# Patient Record
Sex: Female | Born: 1970
Health system: Southern US, Community
[De-identification: ages and names within clinical notes are randomized; demographics above are authoritative.]

## PROBLEM LIST (undated history)

## (undated) DIAGNOSIS — E669 Obesity, unspecified: Secondary | ICD-10-CM

## (undated) DIAGNOSIS — I10 Essential (primary) hypertension: Secondary | ICD-10-CM

## (undated) HISTORY — DX: Essential (primary) hypertension: I10

## (undated) HISTORY — PX: COLONOSCOPY: SHX174

## (undated) HISTORY — DX: Obesity, unspecified: E66.9

---

## 2005-03-20 ENCOUNTER — Emergency Department (HOSPITAL_COMMUNITY): Admission: EM | Admit: 2005-03-20 | Discharge: 2005-03-20 | Payer: Self-pay | Admitting: Family Medicine

## 2006-05-02 ENCOUNTER — Emergency Department (HOSPITAL_COMMUNITY): Admission: EM | Admit: 2006-05-02 | Discharge: 2006-05-02 | Payer: Self-pay | Admitting: Family Medicine

## 2009-06-22 ENCOUNTER — Encounter: Admission: RE | Admit: 2009-06-22 | Discharge: 2009-06-22 | Payer: Self-pay | Admitting: Internal Medicine

## 2010-05-21 ENCOUNTER — Encounter: Payer: Self-pay | Admitting: Internal Medicine

## 2014-10-20 ENCOUNTER — Other Ambulatory Visit: Payer: Self-pay

## 2014-10-20 DIAGNOSIS — Z1231 Encounter for screening mammogram for malignant neoplasm of breast: Secondary | ICD-10-CM

## 2014-10-27 ENCOUNTER — Ambulatory Visit
Admission: RE | Admit: 2014-10-27 | Discharge: 2014-10-27 | Disposition: A | Discharge status: Home / Self Care | Payer: Commercial Managed Care - HMO | Source: Ambulatory Visit

## 2014-10-27 DIAGNOSIS — Z1231 Encounter for screening mammogram for malignant neoplasm of breast: Secondary | ICD-10-CM

## 2017-03-27 ENCOUNTER — Other Ambulatory Visit: Payer: Self-pay | Admitting: Nurse Practitioner

## 2017-03-27 ENCOUNTER — Ambulatory Visit
Admission: RE | Admit: 2017-03-27 | Discharge: 2017-03-27 | Disposition: A | Discharge status: Home / Self Care | Payer: Worker's Compensation | Source: Ambulatory Visit | Attending: Nurse Practitioner | Admitting: Nurse Practitioner

## 2017-03-27 DIAGNOSIS — M25461 Effusion, right knee: Secondary | ICD-10-CM

## 2017-03-27 DIAGNOSIS — M25532 Pain in left wrist: Secondary | ICD-10-CM

## 2017-03-27 DIAGNOSIS — W19XXXA Unspecified fall, initial encounter: Secondary | ICD-10-CM

## 2017-03-27 DIAGNOSIS — M25561 Pain in right knee: Secondary | ICD-10-CM

## 2017-03-27 DIAGNOSIS — M25432 Effusion, left wrist: Secondary | ICD-10-CM

## 2017-05-27 DIAGNOSIS — I10 Essential (primary) hypertension: Secondary | ICD-10-CM | POA: Diagnosis not present

## 2017-05-27 DIAGNOSIS — K061 Gingival enlargement: Secondary | ICD-10-CM | POA: Diagnosis not present

## 2017-05-27 DIAGNOSIS — Z0000 Encounter for general adult medical examination without abnormal findings: Secondary | ICD-10-CM | POA: Diagnosis not present

## 2017-06-05 ENCOUNTER — Other Ambulatory Visit: Payer: Self-pay | Admitting: Internal Medicine

## 2017-06-05 DIAGNOSIS — Z1231 Encounter for screening mammogram for malignant neoplasm of breast: Secondary | ICD-10-CM

## 2017-06-25 ENCOUNTER — Ambulatory Visit: Payer: Self-pay

## 2017-06-26 ENCOUNTER — Ambulatory Visit
Admission: RE | Admit: 2017-06-26 | Discharge: 2017-06-26 | Disposition: A | Discharge status: Home / Self Care | Payer: 59 | Source: Ambulatory Visit | Attending: Internal Medicine | Admitting: Internal Medicine

## 2017-06-26 DIAGNOSIS — Z1231 Encounter for screening mammogram for malignant neoplasm of breast: Secondary | ICD-10-CM

## 2018-04-27 ENCOUNTER — Other Ambulatory Visit: Payer: Self-pay | Admitting: Internal Medicine

## 2018-06-09 ENCOUNTER — Other Ambulatory Visit: Payer: Self-pay | Admitting: Internal Medicine

## 2018-06-23 ENCOUNTER — Encounter: Payer: Self-pay | Admitting: Internal Medicine

## 2018-06-23 ENCOUNTER — Ambulatory Visit (INDEPENDENT_AMBULATORY_CARE_PROVIDER_SITE_OTHER): Payer: 59 | Admitting: Internal Medicine

## 2018-06-23 VITALS — BP 136/88 | HR 78 | Temp 98.1°F | Ht 68.4 in | Wt 273.4 lb

## 2018-06-23 DIAGNOSIS — Z0000 Encounter for general adult medical examination without abnormal findings: Secondary | ICD-10-CM

## 2018-06-23 DIAGNOSIS — I10 Essential (primary) hypertension: Secondary | ICD-10-CM

## 2018-06-23 LAB — POCT URINALYSIS DIPSTICK
BILIRUBIN UA: NEGATIVE
Blood, UA: NEGATIVE
GLUCOSE UA: NEGATIVE
KETONES UA: NEGATIVE
Nitrite, UA: NEGATIVE
Protein, UA: NEGATIVE
Spec Grav, UA: 1.025 (ref 1.010–1.025)
UROBILINOGEN UA: 1 U/dL
pH, UA: 6.5 (ref 5.0–8.0)

## 2018-06-23 LAB — POCT UA - MICROALBUMIN
ALBUMIN/CREATININE RATIO, URINE, POC: 300
Creatinine, POC: 300 mg/dL
Microalbumin Ur, POC: 80 mg/L

## 2018-06-23 NOTE — Patient Instructions (Signed)

## 2018-06-23 NOTE — Progress Notes (Signed)
Subjective:     Patient ID: Yvonne Waters , female    DOB: [birth year] , 48 y.o.   MRN: [id number]   Chief Complaint  Patient presents with  . Annual Exam  . Hypertension    HPI  She is here today for a full physical examination.  She is followed by Dr. Radene Waters for her GYN exams. She reports she goes every year.   Hypertension  This is a chronic problem. The current episode started more than 1 year ago. The problem has been gradually improving since onset. The problem is controlled. Pertinent negatives include no blurred vision, chest pain, palpitations or shortness of breath. Compliance problems include exercise.    She reports compliance with meds.   History reviewed. No pertinent past medical history.   Family History  Problem Relation Age of Onset  . Hypertension Mother      Current Outpatient Medications:  .  Olmesartan-amLODIPine-HCTZ 40-10-25 MG TABS, Take 1 tablet by mouth daily., Disp: , Rfl:  .  Vitamin D, Ergocalciferol, (DRISDOL) 1.25 MG (50000 UT) CAPS capsule, TAKE ONE CAPSULE ON TUESDAY AND FRIDAY EVERY WEEK, Disp: 8 capsule, Rfl: 2   No Known Allergies   Review of Systems  Constitutional: Negative.   HENT: Negative.   Eyes: Negative.  Negative for blurred vision.  Respiratory: Negative.  Negative for shortness of breath.   Cardiovascular: Negative.  Negative for chest pain and palpitations.  Gastrointestinal: Negative.   Endocrine: Negative.   Genitourinary: Negative.   Musculoskeletal: Negative.   Skin: Negative.   Allergic/Immunologic: Negative.   Neurological: Negative.   Hematological: Negative.   Psychiatric/Behavioral: Negative.      Today's Vitals   06/23/18 0930  BP: 136/88  Pulse: 78  Temp: 98.1 F (36.7 C)  TempSrc: Oral  Weight: 273 lb 6.4 oz (124 kg)  Height: 5' 8.4" (1.737 m)   Body mass index is 41.09 kg/m.   Objective:  Physical Exam Vitals signs and nursing note reviewed.  Constitutional:      Appearance: Normal  appearance. She is obese.  HENT:     Head: Normocephalic and atraumatic.     Right Ear: Tympanic membrane, ear canal and external ear normal.     Left Ear: Tympanic membrane, ear canal and external ear normal.     Nose: Nose normal.     Mouth/Throat:     Mouth: Mucous membranes are moist.     Pharynx: Oropharynx is clear.     Comments: Gingival hyperplasia Eyes:     Extraocular Movements: Extraocular movements intact.     Conjunctiva/sclera: Conjunctivae normal.     Pupils: Pupils are equal, round, and reactive to light.  Neck:     Musculoskeletal: Normal range of motion and neck supple.  Cardiovascular:     Rate and Rhythm: Normal rate and regular rhythm.     Pulses: Normal pulses.     Heart sounds: Normal heart sounds.  Pulmonary:     Effort: Pulmonary effort is normal.     Breath sounds: Normal breath sounds.  Chest:     Breasts:        Right: Normal. No swelling, bleeding, inverted nipple, mass or nipple discharge.        Left: Normal. No swelling, bleeding, inverted nipple, mass or nipple discharge.  Abdominal:     General: Bowel sounds are normal.     Palpations: Abdomen is soft.  Genitourinary:    Comments: deferred Musculoskeletal: Normal range of motion.  Skin:  General: Skin is warm and dry.  Neurological:     General: No focal deficit present.     Mental Status: She is alert.  Psychiatric:        Mood and Affect: Mood normal.        Behavior: Behavior normal.         Assessment And Plan:     1. Routine general medical examination at a health care facility  A full exam was performed.  Importance of monthly self breast exams was discussed to the patient. PATIENT HAS BEEN ADVISED TO GET 30-45 MINUTES REGULAR EXERCISE NO LESS THAN FOUR TO FIVE DAYS PER WEEK - BOTH WEIGHTBEARING EXERCISES AND AEROBIC ARE RECOMMENDED.  SHE IS ADVISED TO FOLLOW A HEALTHY DIET WITH AT LEAST SIX FRUITS/VEGGIES PER DAY, DECREASE INTAKE OF RED MEAT, AND TO INCREASE FISH INTAKE TO  TWO DAYS PER WEEK.  MEATS/FISH SHOULD NOT BE FRIED, BAKED OR BROILED IS PREFERABLE.  I SUGGEST WEARING SPF 50 SUNSCREEN ON EXPOSED PARTS AND ESPECIALLY WHEN IN THE DIRECT SUNLIGHT FOR AN EXTENDED PERIOD OF TIME.  PLEASE AVOID FAST FOOD RESTAURANTS AND INCREASE YOUR WATER INTAKE.  - CMP14+EGFR - CBC - Lipid panel - Hemoglobin A1c - TSH  2. Essential hypertension, benign  Fair control. She will continue with current meds. She is encouraged to avoid adding salt to her foods. EKG performed, no new changes noted. She will rto in six months for re-evaluation.  - EKG 12-Lead - POCT Urinalysis Dipstick (90383) - POCT UA - Microalbumin        Yvonne Greenland, MD

## 2018-06-24 LAB — CMP14+EGFR
A/G RATIO: 1.2 (ref 1.2–2.2)
ALT: 11 IU/L (ref 0–32)
AST: 12 IU/L (ref 0–40)
Albumin: 3.8 g/dL (ref 3.8–4.8)
Alkaline Phosphatase: 104 IU/L (ref 39–117)
BILIRUBIN TOTAL: 0.4 mg/dL (ref 0.0–1.2)
BUN/Creatinine Ratio: 13 (ref 9–23)
BUN: 10 mg/dL (ref 6–24)
CALCIUM: 8.9 mg/dL (ref 8.7–10.2)
CO2: 26 mmol/L (ref 20–29)
Chloride: 102 mmol/L (ref 96–106)
Creatinine, Ser: 0.79 mg/dL (ref 0.57–1.00)
GFR calc Af Amer: 104 mL/min/{1.73_m2} (ref >59)
GFR, EST NON AFRICAN AMERICAN: 90 mL/min/{1.73_m2} (ref >59)
Globulin, Total: 3.2 g/dL (ref 1.5–4.5)
Glucose: 113 mg/dL — ABNORMAL HIGH (ref 65–99)
POTASSIUM: 4 mmol/L (ref 3.5–5.2)
Sodium: 141 mmol/L (ref 134–144)
Total Protein: 7 g/dL (ref 6.0–8.5)

## 2018-06-24 LAB — LIPID PANEL
CHOLESTEROL TOTAL: 166 mg/dL (ref 100–199)
Chol/HDL Ratio: 2.3 ratio (ref 0.0–4.4)
HDL: 71 mg/dL (ref >39)
LDL CALC: 84 mg/dL (ref 0–99)
TRIGLYCERIDES: 56 mg/dL (ref 0–149)
VLDL Cholesterol Cal: 11 mg/dL (ref 5–40)

## 2018-06-24 LAB — CBC
HEMOGLOBIN: 12.5 g/dL (ref 11.1–15.9)
Hematocrit: 37.8 % (ref 34.0–46.6)
MCH: 28.1 pg (ref 26.6–33.0)
MCHC: 33.1 g/dL (ref 31.5–35.7)
MCV: 85 fL (ref 79–97)
Platelets: 257 10*3/uL (ref 150–450)
RBC: 4.45 x10E6/uL (ref 3.77–5.28)
RDW: 13.4 % (ref 11.7–15.4)
WBC: 6.1 10*3/uL (ref 3.4–10.8)

## 2018-06-24 LAB — HEMOGLOBIN A1C
Est. average glucose Bld gHb Est-mCnc: 131 mg/dL
Hgb A1c MFr Bld: 6.2 % — ABNORMAL HIGH (ref 4.8–5.6)

## 2018-06-24 LAB — TSH: TSH: 0.653 u[IU]/mL (ref 0.450–4.500)

## 2018-07-02 ENCOUNTER — Other Ambulatory Visit: Payer: Self-pay | Admitting: Internal Medicine

## 2018-07-02 DIAGNOSIS — Z1231 Encounter for screening mammogram for malignant neoplasm of breast: Secondary | ICD-10-CM

## 2018-07-31 ENCOUNTER — Ambulatory Visit: Payer: Self-pay

## 2018-10-01 ENCOUNTER — Other Ambulatory Visit: Payer: Self-pay

## 2018-10-01 ENCOUNTER — Ambulatory Visit
Admission: RE | Admit: 2018-10-01 | Discharge: 2018-10-01 | Disposition: A | Discharge status: Home / Self Care | Payer: 59 | Source: Ambulatory Visit | Attending: Internal Medicine | Admitting: Internal Medicine

## 2018-10-01 DIAGNOSIS — Z1231 Encounter for screening mammogram for malignant neoplasm of breast: Secondary | ICD-10-CM

## 2018-11-02 ENCOUNTER — Other Ambulatory Visit: Payer: Self-pay | Admitting: Internal Medicine

## 2018-12-22 ENCOUNTER — Other Ambulatory Visit: Payer: Self-pay

## 2018-12-22 ENCOUNTER — Ambulatory Visit: Payer: 59 | Admitting: Internal Medicine

## 2018-12-22 ENCOUNTER — Encounter: Payer: Self-pay | Admitting: Internal Medicine

## 2018-12-22 VITALS — BP 144/90 | HR 72 | Temp 98.8°F | Ht 68.4 in | Wt 268.6 lb

## 2018-12-22 DIAGNOSIS — Z6841 Body Mass Index (BMI) 40.0 and over, adult: Secondary | ICD-10-CM | POA: Diagnosis not present

## 2018-12-22 DIAGNOSIS — R7309 Other abnormal glucose: Secondary | ICD-10-CM | POA: Diagnosis not present

## 2018-12-22 DIAGNOSIS — I10 Essential (primary) hypertension: Secondary | ICD-10-CM | POA: Diagnosis not present

## 2018-12-22 LAB — BMP8+EGFR
BUN/Creatinine Ratio: 11 (ref 9–23)
BUN: 10 mg/dL (ref 6–24)
CO2: 24 mmol/L (ref 20–29)
Calcium: 9.3 mg/dL (ref 8.7–10.2)
Chloride: 101 mmol/L (ref 96–106)
Creatinine, Ser: 0.93 mg/dL (ref 0.57–1.00)
GFR calc Af Amer: 85 mL/min/{1.73_m2} (ref >59)
GFR calc non Af Amer: 74 mL/min/{1.73_m2} (ref >59)
Glucose: 109 mg/dL — ABNORMAL HIGH (ref 65–99)
Potassium: 3.9 mmol/L (ref 3.5–5.2)
Sodium: 138 mmol/L (ref 134–144)

## 2018-12-22 LAB — HEMOGLOBIN A1C
Est. average glucose Bld gHb Est-mCnc: 128 mg/dL
Hgb A1c MFr Bld: 6.1 % — ABNORMAL HIGH (ref 4.8–5.6)

## 2018-12-22 MED ORDER — MAGNESIUM 400 MG PO CAPS: 400.0000 mg | ORAL_CAPSULE | Freq: Every day | ORAL | 1 refills | Status: DC

## 2018-12-22 MED ORDER — OLMESARTAN-AMLODIPINE-HCTZ 40-10-25 MG PO TABS: 1.0000 | ORAL_TABLET | Freq: Every day | ORAL | 1 refills | Status: DC

## 2018-12-22 NOTE — Progress Notes (Signed)
Subjective:     Patient ID: Yvonne Waters , female    DOB: Jun 26, 1970 , 48 y.o.   MRN: [id number]   Chief Complaint  Patient presents with  . Hypertension    HPI  Hypertension This is a chronic problem. The current episode started more than 1 year ago. The problem has been gradually improving since onset. The problem is uncontrolled. Pertinent negatives include no blurred vision, chest pain, palpitations or shortness of breath. Past treatments include angiotensin blockers, calcium channel blockers and diuretics. The current treatment provides moderate improvement. Compliance problems include exercise.      History reviewed. No pertinent past medical history.   Family History  Problem Relation Age of Onset  . Hypertension Mother   . Healthy Father      Current Outpatient Medications:  Marland Kitchen  Vitamin D, Ergocalciferol, (DRISDOL) 1.25 MG (50000 UT) CAPS capsule, TAKE ONE CAPSULE ON TUESDAY AND FRIDAY EVERY WEEK, Disp: 8 capsule, Rfl: 2 .  Magnesium 400 MG CAPS, Take 400 mg by mouth at bedtime., Disp: 90 capsule, Rfl: 1 .  Olmesartan-amLODIPine-HCTZ 40-10-25 MG TABS, Take 1 tablet by mouth daily., Disp: 90 tablet, Rfl: 1   No Known Allergies   Review of Systems  Constitutional: Negative.   Eyes: Negative for blurred vision.  Respiratory: Negative.  Negative for shortness of breath.   Cardiovascular: Negative.  Negative for chest pain and palpitations.  Gastrointestinal: Negative.   Neurological: Negative.   Psychiatric/Behavioral: Negative.      Today's Vitals   12/22/18 0847  BP: (!) 144/90  Pulse: 72  Temp: 98.8 F (37.1 C)  TempSrc: Oral  Weight: 268 lb 10.4 oz (121.9 kg)  Height: 5' 8.4" (1.737 m)   Body mass index is 40.37 kg/m.   Objective:  Physical Exam Vitals signs and nursing note reviewed.  Constitutional:      Appearance: Normal appearance. She is obese.  HENT:     Head: Normocephalic and atraumatic.  Cardiovascular:     Rate and Rhythm: Normal rate  and regular rhythm.     Heart sounds: Normal heart sounds.  Pulmonary:     Effort: Pulmonary effort is normal.     Breath sounds: Normal breath sounds.  Skin:    General: Skin is warm.  Neurological:     General: No focal deficit present.     Mental Status: She is alert.  Psychiatric:        Mood and Affect: Mood normal.        Behavior: Behavior normal.         Assessment And Plan:     1. Essential hypertension, benign  Fair control.  She will continue with current meds for now. I will add magnesium 450m nightly to her current regimen. She will rto in March 2021 for her next physical examination.   - BMP8+EGFR  2. Other abnormal glucose  HER A1C HAS BEEN ELEVATED IN THE PAST. I WILL CHECK AN A1C, BMET TODAY. SHE WAS ENCOURAGED TO AVOID SUGARY BEVERAGES AND PROCESSED FOODS INCLUDNG BREADS, RICE AND PASTA.  - Hemoglobin A1c  3. Class 3 severe obesity due to excess calories with serious comorbidity and body mass index (BMI) of 40.0 to 44.9 in adult ([facility]  She was congratulated on her 5 pound weight loss and encouraged to keep up the great work.  Importance of achieving optimal weight to decrease risk of cardiovascular disease and cancers was discussed with the patient in full detail. She is encouraged to exercise no less  than five days weekly, for at least 30 minutes.   She was given tips to incorporate more activity into her daily routine - take stairs when possible, park farther away from her job, grocery stores, etc.    Maximino Greenland, MD    THE PATIENT IS ENCOURAGED TO PRACTICE SOCIAL DISTANCING DUE TO THE COVID-19 PANDEMIC.

## 2018-12-22 NOTE — Patient Instructions (Signed)

## 2018-12-24 ENCOUNTER — Other Ambulatory Visit: Payer: Self-pay | Admitting: Internal Medicine

## 2018-12-28 ENCOUNTER — Other Ambulatory Visit: Payer: Self-pay | Admitting: Internal Medicine

## 2018-12-31 ENCOUNTER — Telehealth: Payer: Self-pay

## 2018-12-31 NOTE — Telephone Encounter (Signed)
Left vm for pt to return call. Pt should take 5000 units of vit d otc capsules

## 2019-04-16 HISTORY — PX: OTHER SURGICAL HISTORY: SHX169

## 2019-06-21 ENCOUNTER — Other Ambulatory Visit: Payer: Self-pay | Admitting: Internal Medicine

## 2019-06-29 ENCOUNTER — Other Ambulatory Visit: Payer: Self-pay

## 2019-06-29 ENCOUNTER — Ambulatory Visit: Payer: 59 | Admitting: Internal Medicine

## 2019-06-29 ENCOUNTER — Encounter: Payer: Self-pay | Admitting: Internal Medicine

## 2019-06-29 VITALS — BP 144/92 | HR 77 | Temp 98.7°F | Ht 71.4 in | Wt 247.0 lb

## 2019-06-29 DIAGNOSIS — E6609 Other obesity due to excess calories: Secondary | ICD-10-CM | POA: Diagnosis not present

## 2019-06-29 DIAGNOSIS — Z6834 Body mass index (BMI) 34.0-34.9, adult: Secondary | ICD-10-CM

## 2019-06-29 DIAGNOSIS — Z1231 Encounter for screening mammogram for malignant neoplasm of breast: Secondary | ICD-10-CM

## 2019-06-29 DIAGNOSIS — I10 Essential (primary) hypertension: Secondary | ICD-10-CM | POA: Diagnosis not present

## 2019-06-29 DIAGNOSIS — Z1211 Encounter for screening for malignant neoplasm of colon: Secondary | ICD-10-CM

## 2019-06-29 DIAGNOSIS — Z0000 Encounter for general adult medical examination without abnormal findings: Secondary | ICD-10-CM

## 2019-06-29 LAB — POCT URINALYSIS DIPSTICK
Bilirubin, UA: NEGATIVE
Blood, UA: NEGATIVE
Glucose, UA: NEGATIVE
Ketones, UA: NEGATIVE
Nitrite, UA: NEGATIVE
Protein, UA: NEGATIVE
Spec Grav, UA: 1.025 (ref 1.010–1.025)
Urobilinogen, UA: 1 E.U./dL
pH, UA: 7.5 (ref 5.0–8.0)

## 2019-06-29 LAB — POCT UA - MICROALBUMIN
Albumin/Creatinine Ratio, Urine, POC: <30
Creatinine, POC: 200 mg/dL
Microalbumin Ur, POC: 10 mg/L

## 2019-06-29 MED ORDER — OLMESARTAN-AMLODIPINE-HCTZ 40-10-25 MG PO TABS: 1.0000 | ORAL_TABLET | Freq: Every day | ORAL | 2 refills | Status: DC

## 2019-06-29 NOTE — Patient Instructions (Signed)
Health Maintenance, Female Adopting a healthy lifestyle and getting preventive care are important in promoting health and wellness. Ask your health care provider about:  The right schedule for you to have regular tests and exams.  Things you can do on your own to prevent diseases and keep yourself healthy. What should I know about diet, weight, and exercise? Eat a healthy diet   Eat a diet that includes plenty of vegetables, fruits, low-fat dairy products, and lean protein.  Do not eat a lot of foods that are high in solid fats, added sugars, or sodium. Maintain a healthy weight Body mass index (BMI) is used to identify weight problems. It estimates body fat based on height and weight. Your health care provider can help determine your BMI and help you achieve or maintain a healthy weight. Get regular exercise Get regular exercise. This is one of the most important things you can do for your health. Most adults should:  Exercise for at least 150 minutes each week. The exercise should increase your heart rate and make you sweat (moderate-intensity exercise).  Do strengthening exercises at least twice a week. This is in addition to the moderate-intensity exercise.  Spend less time sitting. Even light physical activity can be beneficial. Watch cholesterol and blood lipids Have your blood tested for lipids and cholesterol at 48 years of age, then have this test every 5 years. Have your cholesterol levels checked more often if:  Your lipid or cholesterol levels are high.  You are older than 48 years of age.  You are at high risk for heart disease. What should I know about cancer screening? Depending on your health history and family history, you may need to have cancer screening at various ages. This may include screening for:  Breast cancer.  Cervical cancer.  Colorectal cancer.  Skin cancer.  Lung cancer. What should I know about heart disease, diabetes, and high blood  pressure? Blood pressure and heart disease  High blood pressure causes heart disease and increases the risk of stroke. This is more likely to develop in people who have high blood pressure readings, are of African descent, or are overweight.  Have your blood pressure checked: ? Every 3-5 years if you are 18-39 years of age. ? Every year if you are 40 years old or older. Diabetes Have regular diabetes screenings. This checks your fasting blood sugar level. Have the screening done:  Once every three years after age 40 if you are at a normal weight and have a low risk for diabetes.  More often and at a younger age if you are overweight or have a high risk for diabetes. What should I know about preventing infection? Hepatitis B If you have a higher risk for hepatitis B, you should be screened for this virus. Talk with your health care provider to find out if you are at risk for hepatitis B infection. Hepatitis C Testing is recommended for:  Everyone born from 1945 through 1965.  Anyone with known risk factors for hepatitis C. Sexually transmitted infections (STIs)  Get screened for STIs, including gonorrhea and chlamydia, if: ? You are sexually active and are younger than 48 years of age. ? You are older than 48 years of age and your health care provider tells you that you are at risk for this type of infection. ? Your sexual activity has changed since you were last screened, and you are at increased risk for chlamydia or gonorrhea. Ask your health care provider if   you are at risk.  Ask your health care provider about whether you are at high risk for HIV. Your health care provider may recommend a prescription medicine to help prevent HIV infection. If you choose to take medicine to prevent HIV, you should first get tested for HIV. You should then be tested every 3 months for as long as you are taking the medicine. Pregnancy  If you are about to stop having your period (premenopausal) and  you may become pregnant, seek counseling before you get pregnant.  Take 400 to 800 micrograms (mcg) of folic acid every day if you become pregnant.  Ask for birth control (contraception) if you want to prevent pregnancy. Osteoporosis and menopause Osteoporosis is a disease in which the bones lose minerals and strength with aging. This can result in bone fractures. If you are 65 years old or older, or if you are at risk for osteoporosis and fractures, ask your health care provider if you should:  Be screened for bone loss.  Take a calcium or vitamin D supplement to lower your risk of fractures.  Be given hormone replacement therapy (HRT) to treat symptoms of menopause. Follow these instructions at home: Lifestyle  Do not use any products that contain nicotine or tobacco, such as cigarettes, e-cigarettes, and chewing tobacco. If you need help quitting, ask your health care provider.  Do not use street drugs.  Do not share needles.  Ask your health care provider for help if you need support or information about quitting drugs. Alcohol use  Do not drink alcohol if: ? Your health care provider tells you not to drink. ? You are pregnant, may be pregnant, or are planning to become pregnant.  If you drink alcohol: ? Limit how much you use to 0-1 drink a day. ? Limit intake if you are breastfeeding.  Be aware of how much alcohol is in your drink. In the [place] one drink equals one 12 oz bottle of beer (355 mL), one 5 oz glass of wine (148 mL), or one 1 oz glass of hard liquor (44 mL). General instructions  Schedule regular health, dental, and eye exams.  Stay current with your vaccines.  Tell your health care provider if: ? You often feel depressed. ? You have ever been abused or do not feel safe at home. Summary  Adopting a healthy lifestyle and getting preventive care are important in promoting health and wellness.  Follow your health care provider's instructions about healthy  diet, exercising, and getting tested or screened for diseases.  Follow your health care provider's instructions on monitoring your cholesterol and blood pressure. This information is not intended to replace advice given to you by your health care provider. Make sure you discuss any questions you have with your health care provider. Document Revised: 04/09/2018 Document Reviewed: 04/09/2018 Elsevier Patient Education  2020 [employer]  

## 2019-06-29 NOTE — Progress Notes (Signed)
This visit occurred during the SARS-CoV-2 public health emergency.  Safety protocols were in place, including screening questions prior to the visit, additional usage of staff PPE, and extensive cleaning of exam room while observing appropriate contact time as indicated for disinfecting solutions.  Subjective:     Patient ID: Yvonne Waters , female    DOB: [birth year] , 49 y.o.   MRN: [id number]   Chief Complaint  Patient presents with  . Annual Exam  . Hypertension    HPI  She is here today for a full physical exam. She is followed by Dr. Saul Fordyce for her GYN exams.  Her last exam was 2 years ago.  She has no specific concerns or complaints at this time. She reports compliance with meds.   Hypertension This is a chronic problem. The current episode started more than 1 year ago. The problem has been gradually improving since onset. The problem is uncontrolled. Pertinent negatives include no blurred vision, chest pain, palpitations or shortness of breath. Past treatments include angiotensin blockers, calcium channel blockers and diuretics. The current treatment provides moderate improvement. Compliance problems include exercise.      Past Medical History:  Diagnosis Date  . HTN (hypertension)   . Obesity      Family History  Problem Relation Age of Onset  . Hypertension Mother   . Healthy Father      Current Outpatient Medications:  Marland Kitchen  Magnesium 400 MG CAPS, Take 400 mg by mouth at bedtime., Disp: 90 capsule, Rfl: 1 .  Olmesartan-amLODIPine-HCTZ 40-10-25 MG TABS, Take 1 tablet by mouth daily., Disp: 90 tablet, Rfl: 2 .  VITAMIN D PO, Take by mouth., Disp: , Rfl:  .  Vitamin D, Ergocalciferol, (DRISDOL) 1.25 MG (50000 UT) CAPS capsule, TAKE ONE CAPSULE ON TUESDAY AND FRIDAY EVERY WEEK (Patient not taking: Reported on 06/29/2019), Disp: 8 capsule, Rfl: 2   No Known Allergies    The patient states she uses none for birth control. Last LMP was Patient's last menstrual period was  06/08/2019.. Negative for Dysmenorrhea Negative for: breast discharge, breast lump(s), breast pain and breast self exam. Associated symptoms include abnormal vaginal bleeding. Pertinent negatives include abnormal bleeding (hematology), anxiety, decreased libido, depression, difficulty falling sleep, dyspareunia, history of infertility, nocturia, sexual dysfunction, sleep disturbances, urinary incontinence, urinary urgency, vaginal discharge and vaginal itching. Diet regular.The patient states her exercise level is  intermittent.   . The patient's tobacco use is:  Social History   Tobacco Use  Smoking Status Never Smoker  Smokeless Tobacco Never Used  . She has been exposed to passive smoke. The patient's alcohol use is:  Social History   Substance and Sexual Activity  Alcohol Use Never    Review of Systems  Constitutional: Negative.   HENT: Negative.   Eyes: Negative.  Negative for blurred vision.  Respiratory: Negative.  Negative for shortness of breath.   Cardiovascular: Negative.  Negative for chest pain and palpitations.  Endocrine: Negative.   Genitourinary: Negative.   Musculoskeletal: Negative.   Skin: Negative.   Allergic/Immunologic: Negative.   Neurological: Negative.   Hematological: Negative.   Psychiatric/Behavioral: Negative.      Today's Vitals   06/29/19 0922  BP: (!) 144/92  Pulse: 77  Temp: 98.7 F (37.1 C)  TempSrc: Oral  Weight: 247 lb (112 kg)  Height: 5' 11.4" (1.814 m)   Body mass index is 34.06 kg/m.   Wt Readings from Last 3 Encounters:  06/29/19 247 lb (112 kg)  12/22/18 268 lb  10.4 oz (121.9 kg)  06/23/18 273 lb 6.4 oz (124 kg)     Objective:  Physical Exam Vitals and nursing note reviewed.  Constitutional:      Appearance: Normal appearance.  HENT:     Head: Normocephalic and atraumatic.     Right Ear: Tympanic membrane, ear canal and external ear normal.     Left Ear: Tympanic membrane, ear canal and external ear normal.      Nose:     Comments: Deferred, masked    Mouth/Throat:     Comments: Deferred, masked Eyes:     Extraocular Movements: Extraocular movements intact.     Conjunctiva/sclera: Conjunctivae normal.     Pupils: Pupils are equal, round, and reactive to light.  Cardiovascular:     Rate and Rhythm: Normal rate and regular rhythm.     Pulses: Normal pulses.     Heart sounds: Normal heart sounds.  Pulmonary:     Effort: Pulmonary effort is normal.     Breath sounds: Normal breath sounds.  Chest:     Breasts: Tanner Score is 5.        Right: Normal.        Left: Normal.  Abdominal:     General: Abdomen is flat. Bowel sounds are normal.     Palpations: Abdomen is soft.  Genitourinary:    Comments: deferred Musculoskeletal:        General: Normal range of motion.     Cervical back: Normal range of motion and neck supple.  Skin:    General: Skin is warm and dry.  Neurological:     General: No focal deficit present.     Mental Status: She is alert and oriented to person, place, and time.  Psychiatric:        Mood and Affect: Mood normal.        Behavior: Behavior normal.         Assessment And Plan:     1. Routine general medical examination at health care facility  A full exam was performed.  Importance of monthly breast exam was discussed with the patient. PATIENT IS ADVISED TO GET 30-45 MINUTES REGULAR EXERCISE NO LESS THAN FOUR TO FIVE DAYS PER WEEK - BOTH WEIGHTBEARING EXERCISES AND AEROBIC ARE RECOMMENDED.  HE/SHE IS ADVISED TO FOLLOW A HEALTHY DIET WITH AT LEAST SIX FRUITS/VEGGIES PER DAY, DECREASE INTAKE OF RED MEAT, AND TO INCREASE FISH INTAKE TO TWO DAYS PER WEEK.  MEATS/FISH SHOULD NOT BE FRIED, BAKED OR BROILED IS PREFERABLE.  I SUGGEST WEARING SPF 50 SUNSCREEN ON EXPOSED PARTS AND ESPECIALLY WHEN IN THE DIRECT SUNLIGHT FOR AN EXTENDED PERIOD OF TIME.  PLEASE AVOID FAST FOOD RESTAURANTS AND INCREASE YOUR WATER INTAKE.  - CMP14+EGFR - CBC - Lipid panel - Hemoglobin A1c -  TSH  2. Essential hypertension, benign  Chronic, fair control. She will continue with current meds for now. Repeat BP was 134/96. She is encouraged to avoid adding salt to her foods.  She will rto in 2 weeks for a nurse visit. EKG performed, NSR w/o acute changes.   - POCT Urinalysis Dipstick (81002) - POCT UA - Microalbumin - EKG 12-Lead   3. Class 1 obesity due to excess calories with serious comorbidity and body mass index (BMI) of 34.0 to 34.9 in adult  She was congratulated on her 21 pound weight loss in the past six months. She is encouraged to strive for BMI less than 30 to decrease cardiac risk. I advised her to  aim for 30 minutes of exercise five days per week.   4. Colon cancer screening  I will refer her to GI for CRC screening.   - Ambulatory referral to Gastroenterology  5. Encounter for screening mammogram for malignant neoplasm of breast  [facility].   - MM Digital Screening; Future   Maximino Greenland, MD    THE PATIENT IS ENCOURAGED TO PRACTICE SOCIAL DISTANCING DUE TO THE COVID-19 PANDEMIC.

## 2019-06-30 LAB — CMP14+EGFR
ALT: 10 IU/L (ref 0–32)
AST: 12 IU/L (ref 0–40)
Albumin/Globulin Ratio: 1.3 (ref 1.2–2.2)
Albumin: 4 g/dL (ref 3.8–4.8)
Alkaline Phosphatase: 101 IU/L (ref 39–117)
BUN/Creatinine Ratio: 12 (ref 9–23)
BUN: 9 mg/dL (ref 6–24)
Bilirubin Total: 0.3 mg/dL (ref 0.0–1.2)
CO2: 23 mmol/L (ref 20–29)
Calcium: 9.3 mg/dL (ref 8.7–10.2)
Chloride: 101 mmol/L (ref 96–106)
Creatinine, Ser: 0.76 mg/dL (ref 0.57–1.00)
GFR calc Af Amer: 108 mL/min/{1.73_m2} (ref >59)
GFR calc non Af Amer: 94 mL/min/{1.73_m2} (ref >59)
Globulin, Total: 3.2 g/dL (ref 1.5–4.5)
Glucose: 99 mg/dL (ref 65–99)
Potassium: 4 mmol/L (ref 3.5–5.2)
Sodium: 138 mmol/L (ref 134–144)
Total Protein: 7.2 g/dL (ref 6.0–8.5)

## 2019-06-30 LAB — CBC
Hematocrit: 34.7 % (ref 34.0–46.6)
Hemoglobin: 11.1 g/dL (ref 11.1–15.9)
MCH: 26.9 pg (ref 26.6–33.0)
MCHC: 32 g/dL (ref 31.5–35.7)
MCV: 84 fL (ref 79–97)
Platelets: 230 10*3/uL (ref 150–450)
RBC: 4.12 x10E6/uL (ref 3.77–5.28)
RDW: 13.5 % (ref 11.7–15.4)
WBC: 7.1 10*3/uL (ref 3.4–10.8)

## 2019-06-30 LAB — LIPID PANEL
Chol/HDL Ratio: 2.4 ratio (ref 0.0–4.4)
Cholesterol, Total: 188 mg/dL (ref 100–199)
HDL: 80 mg/dL (ref >39)
LDL Chol Calc (NIH): 93 mg/dL (ref 0–99)
Triglycerides: 86 mg/dL (ref 0–149)
VLDL Cholesterol Cal: 15 mg/dL (ref 5–40)

## 2019-06-30 LAB — HEMOGLOBIN A1C
Est. average glucose Bld gHb Est-mCnc: 117 mg/dL
Hgb A1c MFr Bld: 5.7 % — ABNORMAL HIGH (ref 4.8–5.6)

## 2019-06-30 LAB — TSH: TSH: 2.07 u[IU]/mL (ref 0.450–4.500)

## 2019-07-01 ENCOUNTER — Telehealth: Payer: Self-pay

## 2019-07-01 NOTE — Telephone Encounter (Signed)
-----   Message from Dorothyann Peng, MD sent at 06/30/2019  8:23 PM EST ----- All labs nl. Chol is great. Predm has improved, a1c down t o.57 from 6.1. Normal is 5.6, she is almost there! Keep up the great work! Thyroid fxn is nl.

## 2019-07-14 ENCOUNTER — Other Ambulatory Visit: Payer: Self-pay

## 2019-07-14 ENCOUNTER — Ambulatory Visit: Payer: 59

## 2019-07-14 VITALS — BP 140/86 | HR 74 | Temp 98.7°F | Ht 71.4 in | Wt 248.0 lb

## 2019-07-14 DIAGNOSIS — I10 Essential (primary) hypertension: Secondary | ICD-10-CM

## 2019-07-14 NOTE — Progress Notes (Signed)
Pt presents today for b/p check pt is currently on Olmesartan-amlodipine HCTZ 40-10-25 MG 1x daily in the a.m. B/p today 140/86 Pt has been working out

## 2019-10-07 ENCOUNTER — Ambulatory Visit
Admission: RE | Admit: 2019-10-07 | Discharge: 2019-10-07 | Disposition: A | Discharge status: Home / Self Care | Payer: 59 | Source: Ambulatory Visit | Attending: Internal Medicine | Admitting: Internal Medicine

## 2019-10-07 ENCOUNTER — Other Ambulatory Visit: Payer: Self-pay

## 2019-10-07 DIAGNOSIS — Z1231 Encounter for screening mammogram for malignant neoplasm of breast: Secondary | ICD-10-CM

## 2019-10-07 LAB — HM COLONOSCOPY

## 2019-10-08 ENCOUNTER — Encounter: Payer: Self-pay | Admitting: Internal Medicine

## 2019-12-30 ENCOUNTER — Encounter: Payer: Self-pay | Admitting: Internal Medicine

## 2019-12-30 ENCOUNTER — Ambulatory Visit: Payer: 59 | Admitting: Internal Medicine

## 2019-12-30 ENCOUNTER — Other Ambulatory Visit: Payer: Self-pay

## 2019-12-30 VITALS — BP 136/70 | HR 64 | Temp 98.5°F | Ht 71.0 in | Wt 245.0 lb

## 2019-12-30 DIAGNOSIS — I10 Essential (primary) hypertension: Secondary | ICD-10-CM

## 2019-12-30 DIAGNOSIS — R7309 Other abnormal glucose: Secondary | ICD-10-CM | POA: Diagnosis not present

## 2019-12-30 DIAGNOSIS — Z1159 Encounter for screening for other viral diseases: Secondary | ICD-10-CM

## 2019-12-30 DIAGNOSIS — E6609 Other obesity due to excess calories: Secondary | ICD-10-CM

## 2019-12-30 DIAGNOSIS — Z6834 Body mass index (BMI) 34.0-34.9, adult: Secondary | ICD-10-CM

## 2019-12-30 NOTE — Progress Notes (Signed)
I,Tianna Badgett,acting as a Education administrator for Maximino Greenland, MD.,have documented all relevant documentation on the behalf of Maximino Greenland, MD,as directed by  Maximino Greenland, MD while in the presence of Maximino Greenland, MD.  This visit occurred during the SARS-CoV-2 public health emergency.  Safety protocols were in place, including screening questions prior to the visit, additional usage of staff PPE, and extensive cleaning of exam room while observing appropriate contact time as indicated for disinfecting solutions.  Subjective:     Patient ID: Yvonne Waters , female    DOB: 1970/07/30 , 49 y.o.   MRN: [id number]   Chief Complaint  Patient presents with  . Hypertension    HPI  Patient is here for hypertension follow up. She states that she is compliant with medication.   Hypertension This is a chronic problem. The current episode started more than 1 year ago. The problem has been gradually improving since onset. The problem is uncontrolled. Pertinent negatives include no blurred vision, chest pain, palpitations or shortness of breath. Past treatments include angiotensin blockers, calcium channel blockers and diuretics. The current treatment provides moderate improvement. Compliance problems include exercise.      Past Medical History:  Diagnosis Date  . HTN (hypertension)   . Obesity      Family History  Problem Relation Age of Onset  . Hypertension Mother   . Healthy Father      Current Outpatient Medications:  Marland Kitchen  Magnesium 400 MG CAPS, Take 400 mg by mouth at bedtime., Disp: 90 capsule, Rfl: 1 .  Multiple Vitamins-Minerals (MULTIVITAMIN WOMEN PO), Take 1 tablet by mouth daily., Disp: , Rfl:  .  Olmesartan-amLODIPine-HCTZ 40-10-25 MG TABS, Take 1 tablet by mouth daily., Disp: 90 tablet, Rfl: 2   No Known Allergies   Review of Systems  Constitutional: Negative.   Eyes: Negative for blurred vision.  Respiratory: Negative.  Negative for shortness of breath.    Cardiovascular: Negative.  Negative for chest pain and palpitations.  Gastrointestinal: Negative.   Neurological: Negative.      Today's Vitals   12/30/19 0853  BP: 136/70  Pulse: 64  Temp: 98.5 F (36.9 C)  TempSrc: Oral  Weight: 245 lb (111.1 kg)  Height: '5\' 11"'  (1.803 m)   Body mass index is 34.17 kg/m.   Objective:  Physical Exam Vitals and nursing note reviewed.  Constitutional:      Appearance: Normal appearance. She is obese.  HENT:     Head: Normocephalic and atraumatic.  Cardiovascular:     Rate and Rhythm: Normal rate and regular rhythm.     Heart sounds: Normal heart sounds.  Pulmonary:     Effort: Pulmonary effort is normal.     Breath sounds: Normal breath sounds.  Skin:    General: Skin is warm.  Neurological:     General: No focal deficit present.     Mental Status: She is alert.  Psychiatric:        Mood and Affect: Mood normal.        Behavior: Behavior normal.         Assessment And Plan:     1. Essential hypertension, benign Comments: Chronic, improved control.  She is aware that optimal BP is less than 130/80.  She will continue with current meds. She will rto in 6 months for physical.  - BMP8+EGFR   2. Other abnormal glucose  HER A1C HAS BEEN ELEVATED IN THE PAST. I WILL CHECK AN A1C, BMET TODAY. SHE  WAS ENCOURAGED TO AVOID SUGARY BEVERAGES AND PROCESSED FOODS INCLUDNG BREADS, RICE AND PASTA.  - Hemoglobin A1c  3. Need for hepatitis C screening test Comments: I will check hepatitis c virus antibody.  - Hepatitis C antibody  4. Class 1 obesity due to excess calories with serious comorbidity and body mass index (BMI) of 34.0 to 34.9 in adult She is encouraged to strive for BMI less than 30 to decrease cardiac risk. Advised to aim for at least 150 minutes of exercise per week.  She was congratulated for maintaining her weight loss.   Wt Readings from Last 3 Encounters:  12/30/19 245 lb (111.1 kg)  07/14/19 248 lb (112.5 kg)   06/29/19 247 lb (112 kg)      Patient was given opportunity to ask questions. Patient verbalized understanding of the plan and was able to repeat key elements of the plan. All questions were answered to their satisfaction.  Maximino Greenland, MD   I, Maximino Greenland, MD, have reviewed all documentation for this visit. The documentation on 12/30/19 for the exam, diagnosis, procedures, and orders are all accurate and complete.  THE PATIENT IS ENCOURAGED TO PRACTICE SOCIAL DISTANCING DUE TO THE COVID-19 PANDEMIC.

## 2019-12-30 NOTE — Patient Instructions (Signed)

## 2019-12-31 LAB — BMP8+EGFR
BUN/Creatinine Ratio: 14 (ref 9–23)
BUN: 10 mg/dL (ref 6–24)
CO2: 24 mmol/L (ref 20–29)
Calcium: 9 mg/dL (ref 8.7–10.2)
Chloride: 101 mmol/L (ref 96–106)
Creatinine, Ser: 0.71 mg/dL (ref 0.57–1.00)
GFR calc Af Amer: 116 mL/min/{1.73_m2} (ref >59)
GFR calc non Af Amer: 101 mL/min/{1.73_m2} (ref >59)
Glucose: 101 mg/dL — ABNORMAL HIGH (ref 65–99)
Potassium: 4 mmol/L (ref 3.5–5.2)
Sodium: 136 mmol/L (ref 134–144)

## 2019-12-31 LAB — HEPATITIS C ANTIBODY: Hep C Virus Ab: <0.1 s/co ratio (ref 0.0–0.9)

## 2019-12-31 LAB — HEMOGLOBIN A1C
Est. average glucose Bld gHb Est-mCnc: 128 mg/dL
Hgb A1c MFr Bld: 6.1 % — ABNORMAL HIGH (ref 4.8–5.6)

## 2020-02-03 DIAGNOSIS — R87619 Unspecified abnormal cytological findings in specimens from cervix uteri: Secondary | ICD-10-CM | POA: Insufficient documentation

## 2020-02-10 LAB — HM PAP SMEAR

## 2020-06-29 ENCOUNTER — Ambulatory Visit: Payer: 59 | Admitting: Internal Medicine

## 2020-06-29 ENCOUNTER — Other Ambulatory Visit: Payer: Self-pay

## 2020-06-29 ENCOUNTER — Encounter: Payer: Self-pay | Admitting: Internal Medicine

## 2020-06-29 VITALS — BP 126/82 | HR 70 | Temp 98.2°F | Ht 71.0 in | Wt 251.0 lb

## 2020-06-29 DIAGNOSIS — Z0000 Encounter for general adult medical examination without abnormal findings: Secondary | ICD-10-CM | POA: Diagnosis not present

## 2020-06-29 DIAGNOSIS — Z6835 Body mass index (BMI) 35.0-35.9, adult: Secondary | ICD-10-CM | POA: Diagnosis not present

## 2020-06-29 DIAGNOSIS — I10 Essential (primary) hypertension: Secondary | ICD-10-CM | POA: Diagnosis not present

## 2020-06-29 LAB — POCT URINALYSIS DIPSTICK
Bilirubin, UA: NEGATIVE
Blood, UA: NEGATIVE
Glucose, UA: NEGATIVE
Ketones, UA: NEGATIVE
Leukocytes, UA: NEGATIVE
Nitrite, UA: NEGATIVE
Protein, UA: NEGATIVE
Spec Grav, UA: >=1.03 — AB (ref 1.010–1.025)
Urobilinogen, UA: 0.2 E.U./dL
pH, UA: 7 (ref 5.0–8.0)

## 2020-06-29 LAB — POCT UA - MICROALBUMIN
Albumin/Creatinine Ratio, Urine, POC: 30
Creatinine, POC: 200 mg/dL
Microalbumin Ur, POC: 10 mg/L

## 2020-06-29 MED ORDER — OLMESARTAN-AMLODIPINE-HCTZ 40-10-25 MG PO TABS: 1.0000 | ORAL_TABLET | Freq: Every day | ORAL | 2 refills | Status: DC

## 2020-06-29 NOTE — Progress Notes (Signed)
I,Katawbba Wiggins,acting as a Education administrator for Maximino Greenland, MD.,have documented all relevant documentation on the behalf of Maximino Greenland, MD,as directed by  Maximino Greenland, MD while in the presence of Maximino Greenland, MD.  This visit occurred during the SARS-CoV-2 public health emergency.  Safety protocols were in place, including screening questions prior to the visit, additional usage of staff PPE, and extensive cleaning of exam room while observing appropriate contact time as indicated for disinfecting solutions.  Subjective:     Patient ID: Yvonne Waters , female    DOB: August 25, 1970 , 50 y.o.   MRN: [id number]   Chief Complaint  Patient presents with  . Annual Exam    HPI  She is here today for a full physical exam. She is followed by Dr. Saul Fordyce for her GYN exams.  Her last exam was November 2021.  She reports compliance with meds. Reports she is exercising 3-5 days per week. She denies headaches, chest pain and shortness of breath.   Hypertension This is a chronic problem. The current episode started more than 1 year ago. The problem has been gradually improving since onset. The problem is uncontrolled. Pertinent negatives include no blurred vision, chest pain, palpitations or shortness of breath. Past treatments include angiotensin blockers, calcium channel blockers and diuretics. The current treatment provides moderate improvement. Compliance problems include exercise.      Past Medical History:  Diagnosis Date  . HTN (hypertension)   . Obesity      Family History  Problem Relation Age of Onset  . Hypertension Mother   . Healthy Father      Current Outpatient Medications:  Marland Kitchen  Magnesium 400 MG CAPS, Take 400 mg by mouth at bedtime., Disp: 90 capsule, Rfl: 1 .  Multiple Vitamins-Minerals (MULTIVITAMIN WOMEN PO), Take 1 tablet by mouth daily., Disp: , Rfl:  .  Olmesartan-amLODIPine-HCTZ 40-10-25 MG TABS, Take 1 tablet by mouth daily., Disp: 90 tablet, Rfl: 2   No Known  Allergies    The patient states she uses none for birth control. Last LMP was Patient's last menstrual period was 06/10/2020.. Negative for Dysmenorrhea. Negative for: breast discharge, breast lump(s), breast pain and breast self exam. Associated symptoms include abnormal vaginal bleeding. Pertinent negatives include abnormal bleeding (hematology), anxiety, decreased libido, depression, difficulty falling sleep, dyspareunia, history of infertility, nocturia, sexual dysfunction, sleep disturbances, urinary incontinence, urinary urgency, vaginal discharge and vaginal itching. Diet regular.The patient states her exercise level is  moderate.   . The patient's tobacco use is:  Social History   Tobacco Use  Smoking Status Never Smoker  Smokeless Tobacco Never Used  . She has been exposed to passive smoke. The patient's alcohol use is:  Social History   Substance and Sexual Activity  Alcohol Use Never    Review of Systems  Constitutional: Negative.   HENT: Negative.   Eyes: Negative.  Negative for blurred vision.  Respiratory: Negative.  Negative for shortness of breath.   Cardiovascular: Negative.  Negative for chest pain and palpitations.  Gastrointestinal: Negative.   Endocrine: Negative.   Genitourinary: Negative.   Musculoskeletal: Negative.   Skin: Negative.   Allergic/Immunologic: Negative.   Neurological: Negative.   Hematological: Negative.   Psychiatric/Behavioral: Negative.      Today's Vitals   06/29/20 0924  BP: 126/82  Pulse: 70  Temp: 98.2 F (36.8 C)  TempSrc: Oral  Weight: 251 lb (113.9 kg)  Height: '5\' 11"'  (1.803 m)   Body mass index is 35.01 kg/m.  Wt Readings from Last 3 Encounters:  06/29/20 251 lb (113.9 kg)  12/30/19 245 lb (111.1 kg)  07/14/19 248 lb (112.5 kg)   Objective:  Physical Exam Vitals and nursing note reviewed.  Constitutional:      General: She is not in acute distress.    Appearance: Normal appearance. She is well-developed. She is  obese.  HENT:     Head: Normocephalic and atraumatic.     Right Ear: Hearing, tympanic membrane, ear canal and external ear normal. There is no impacted cerumen.     Left Ear: Hearing, tympanic membrane, ear canal and external ear normal. There is no impacted cerumen.     Nose:     Comments: Deferred, masked     Mouth/Throat:     Comments: Deferred, masked  Eyes:     General: Lids are normal.     Extraocular Movements: Extraocular movements intact.     Conjunctiva/sclera: Conjunctivae normal.     Pupils: Pupils are equal, round, and reactive to light.     Funduscopic exam:    Right eye: No papilledema.        Left eye: No papilledema.  Neck:     Thyroid: No thyroid mass.     Vascular: No carotid bruit.  Cardiovascular:     Rate and Rhythm: Normal rate and regular rhythm.     Pulses: Normal pulses.     Heart sounds: Normal heart sounds. No murmur heard.   Pulmonary:     Effort: Pulmonary effort is normal.     Breath sounds: Normal breath sounds.  Abdominal:     General: Bowel sounds are normal. There is no distension.     Palpations: Abdomen is soft.     Tenderness: There is no abdominal tenderness.  Genitourinary:    Comments: deferred Musculoskeletal:        General: No swelling. Normal range of motion.     Cervical back: Full passive range of motion without pain, normal range of motion and neck supple.     Right lower leg: No edema.     Left lower leg: No edema.  Skin:    General: Skin is warm and dry.     Capillary Refill: Capillary refill takes less than 2 seconds.     Comments: Keloid right breast.  Tattoos b/l UE  Neurological:     General: No focal deficit present.     Mental Status: She is alert and oriented to person, place, and time.     Cranial Nerves: No cranial nerve deficit.     Sensory: No sensory deficit.  Psychiatric:        Mood and Affect: Mood normal.        Behavior: Behavior normal.        Thought Content: Thought content normal.         Judgment: Judgment normal.         Assessment And Plan:     1. Routine general medical examination at health care facility Comments: A full exam was performed. Importance of monthly self breast exams was discussed with the patient. PATIENT IS ADVISED TO GET 30-45 MINUTES REGULAR EXERCISE NO LESS THAN FOUR TO FIVE DAYS PER WEEK - BOTH WEIGHTBEARING EXERCISES AND AEROBIC ARE RECOMMENDED.  PATIENT IS ADVISED TO FOLLOW A HEALTHY DIET WITH AT LEAST SIX FRUITS/VEGGIES PER DAY, DECREASE INTAKE OF RED MEAT, AND TO INCREASE FISH INTAKE TO TWO DAYS PER WEEK.  MEATS/FISH SHOULD NOT BE FRIED, BAKED OR BROILED IS  PREFERABLE.  I SUGGEST WEARING SPF 50 SUNSCREEN ON EXPOSED PARTS AND ESPECIALLY WHEN IN THE DIRECT SUNLIGHT FOR AN EXTENDED PERIOD OF TIME.  PLEASE AVOID FAST FOOD RESTAURANTS AND INCREASE YOUR WATER INTAKE.  - Hemoglobin A1c - CBC - CMP14+EGFR - Lipid panel  2. Essential hypertension, benign Comments: Chronic, well controlled. She is encouraged to follow low sodium diet. EKG performed, NSR w/o acute changes. She will c/w Tribenzor and f/u in 6 months.  - POCT Urinalysis Dipstick (81002) - POCT UA - Microalbumin - EKG 12-Lead  3. Class 2 severe obesity due to excess calories with serious comorbidity and body mass index (BMI) of 35.0 to 35.9 in adult [facility]) Comments: She was commended on her regular exercise regimen. Encouraged to strive for BMI less than 30 to decrease cardiac risk.   Patient was given opportunity to ask questions. Patient verbalized understanding of the plan and was able to repeat key elements of the plan. All questions were answered to their satisfaction.   I, Maximino Greenland, MD, have reviewed all documentation for this visit. The documentation on 06/29/20 for the exam, diagnosis, procedures, and orders are all accurate and complete.  THE PATIENT IS ENCOURAGED TO PRACTICE SOCIAL DISTANCING DUE TO THE COVID-19 PANDEMIC.

## 2020-06-29 NOTE — Patient Instructions (Signed)
Health Maintenance, Female Adopting a healthy lifestyle and getting preventive care are important in promoting health and wellness. Ask your health care provider about:  The right schedule for you to have regular tests and exams.  Things you can do on your own to prevent diseases and keep yourself healthy. What should I know about diet, weight, and exercise? Eat a healthy diet  Eat a diet that includes plenty of vegetables, fruits, low-fat dairy products, and lean protein.  Do not eat a lot of foods that are high in solid fats, added sugars, or sodium.   Maintain a healthy weight Body mass index (BMI) is used to identify weight problems. It estimates body fat based on height and weight. Your health care provider can help determine your BMI and help you achieve or maintain a healthy weight. Get regular exercise Get regular exercise. This is one of the most important things you can do for your health. Most adults should:  Exercise for at least 150 minutes each week. The exercise should increase your heart rate and make you sweat (moderate-intensity exercise).  Do strengthening exercises at least twice a week. This is in addition to the moderate-intensity exercise.  Spend less time sitting. Even light physical activity can be beneficial. Watch cholesterol and blood lipids Have your blood tested for lipids and cholesterol at 49 years of age, then have this test every 5 years. Have your cholesterol levels checked more often if:  Your lipid or cholesterol levels are high.  You are older than 49 years of age.  You are at high risk for heart disease. What should I know about cancer screening? Depending on your health history and family history, you may need to have cancer screening at various ages. This may include screening for:  Breast cancer.  Cervical cancer.  Colorectal cancer.  Skin cancer.  Lung cancer. What should I know about heart disease, diabetes, and high blood  pressure? Blood pressure and heart disease  High blood pressure causes heart disease and increases the risk of stroke. This is more likely to develop in people who have high blood pressure readings, are of African descent, or are overweight.  Have your blood pressure checked: ? Every 3-5 years if you are 18-39 years of age. ? Every year if you are 40 years old or older. Diabetes Have regular diabetes screenings. This checks your fasting blood sugar level. Have the screening done:  Once every three years after age 40 if you are at a normal weight and have a low risk for diabetes.  More often and at a younger age if you are overweight or have a high risk for diabetes. What should I know about preventing infection? Hepatitis B If you have a higher risk for hepatitis B, you should be screened for this virus. Talk with your health care provider to find out if you are at risk for hepatitis B infection. Hepatitis C Testing is recommended for:  Everyone born from 1945 through 1965.  Anyone with known risk factors for hepatitis C. Sexually transmitted infections (STIs)  Get screened for STIs, including gonorrhea and chlamydia, if: ? You are sexually active and are younger than 49 years of age. ? You are older than 49 years of age and your health care provider tells you that you are at risk for this type of infection. ? Your sexual activity has changed since you were last screened, and you are at increased risk for chlamydia or gonorrhea. Ask your health care provider   if you are at risk.  Ask your health care provider about whether you are at high risk for HIV. Your health care provider may recommend a prescription medicine to help prevent HIV infection. If you choose to take medicine to prevent HIV, you should first get tested for HIV. You should then be tested every 3 months for as long as you are taking the medicine. Pregnancy  If you are about to stop having your period (premenopausal) and  you may become pregnant, seek counseling before you get pregnant.  Take 400 to 800 micrograms (mcg) of folic acid every day if you become pregnant.  Ask for birth control (contraception) if you want to prevent pregnancy. Osteoporosis and menopause Osteoporosis is a disease in which the bones lose minerals and strength with aging. This can result in bone fractures. If you are 65 years old or older, or if you are at risk for osteoporosis and fractures, ask your health care provider if you should:  Be screened for bone loss.  Take a calcium or vitamin D supplement to lower your risk of fractures.  Be given hormone replacement therapy (HRT) to treat symptoms of menopause. Follow these instructions at home: Lifestyle  Do not use any products that contain nicotine or tobacco, such as cigarettes, e-cigarettes, and chewing tobacco. If you need help quitting, ask your health care provider.  Do not use street drugs.  Do not share needles.  Ask your health care provider for help if you need support or information about quitting drugs. Alcohol use  Do not drink alcohol if: ? Your health care provider tells you not to drink. ? You are pregnant, may be pregnant, or are planning to become pregnant.  If you drink alcohol: ? Limit how much you use to 0-1 drink a day. ? Limit intake if you are breastfeeding.  Be aware of how much alcohol is in your drink. In the [place] one drink equals one 12 oz bottle of beer (355 mL), one 5 oz glass of wine (148 mL), or one 1 oz glass of hard liquor (44 mL). General instructions  Schedule regular health, dental, and eye exams.  Stay current with your vaccines.  Tell your health care provider if: ? You often feel depressed. ? You have ever been abused or do not feel safe at home. Summary  Adopting a healthy lifestyle and getting preventive care are important in promoting health and wellness.  Follow your health care provider's instructions about healthy  diet, exercising, and getting tested or screened for diseases.  Follow your health care provider's instructions on monitoring your cholesterol and blood pressure. This information is not intended to replace advice given to you by your health care provider. Make sure you discuss any questions you have with your health care provider. Document Revised: 04/09/2018 Document Reviewed: 04/09/2018 Elsevier Patient Education  2021 [employer] Inc[employer]  

## 2020-06-30 LAB — CBC
Hematocrit: 36.6 % (ref 34.0–46.6)
Hemoglobin: 12.2 g/dL (ref 11.1–15.9)
MCH: 28.8 pg (ref 26.6–33.0)
MCHC: 33.3 g/dL (ref 31.5–35.7)
MCV: 86 fL (ref 79–97)
Platelets: 228 10*3/uL (ref 150–450)
RBC: 4.24 x10E6/uL (ref 3.77–5.28)
RDW: 13.8 % (ref 11.7–15.4)
WBC: 6 10*3/uL (ref 3.4–10.8)

## 2020-06-30 LAB — CMP14+EGFR
ALT: 13 IU/L (ref 0–32)
AST: 16 IU/L (ref 0–40)
Albumin/Globulin Ratio: 1.5 (ref 1.2–2.2)
Albumin: 4.1 g/dL (ref 3.8–4.8)
Alkaline Phosphatase: 93 IU/L (ref 44–121)
BUN/Creatinine Ratio: 10 (ref 9–23)
BUN: 8 mg/dL (ref 6–24)
Bilirubin Total: 0.3 mg/dL (ref 0.0–1.2)
CO2: 25 mmol/L (ref 20–29)
Calcium: 9.3 mg/dL (ref 8.7–10.2)
Chloride: 103 mmol/L (ref 96–106)
Creatinine, Ser: 0.81 mg/dL (ref 0.57–1.00)
Globulin, Total: 2.8 g/dL (ref 1.5–4.5)
Glucose: 101 mg/dL — ABNORMAL HIGH (ref 65–99)
Potassium: 3.8 mmol/L (ref 3.5–5.2)
Sodium: 142 mmol/L (ref 134–144)
Total Protein: 6.9 g/dL (ref 6.0–8.5)
eGFR: 89 mL/min/{1.73_m2} (ref >59)

## 2020-06-30 LAB — LIPID PANEL
Chol/HDL Ratio: 2.1 ratio (ref 0.0–4.4)
Cholesterol, Total: 197 mg/dL (ref 100–199)
HDL: 93 mg/dL (ref >39)
LDL Chol Calc (NIH): 93 mg/dL (ref 0–99)
Triglycerides: 58 mg/dL (ref 0–149)
VLDL Cholesterol Cal: 11 mg/dL (ref 5–40)

## 2020-06-30 LAB — HEMOGLOBIN A1C
Est. average glucose Bld gHb Est-mCnc: 128 mg/dL
Hgb A1c MFr Bld: 6.1 % — ABNORMAL HIGH (ref 4.8–5.6)

## 2020-07-08 ENCOUNTER — Encounter: Payer: Self-pay | Admitting: Internal Medicine

## 2020-11-18 ENCOUNTER — Other Ambulatory Visit: Payer: Self-pay | Admitting: Internal Medicine

## 2020-11-18 DIAGNOSIS — Z1231 Encounter for screening mammogram for malignant neoplasm of breast: Secondary | ICD-10-CM

## 2020-11-21 ENCOUNTER — Ambulatory Visit
Admission: RE | Admit: 2020-11-21 | Discharge: 2020-11-21 | Disposition: A | Discharge status: Home / Self Care | Payer: 59 | Source: Ambulatory Visit | Attending: Internal Medicine | Admitting: Internal Medicine

## 2020-11-21 ENCOUNTER — Other Ambulatory Visit: Payer: Self-pay

## 2020-11-21 DIAGNOSIS — Z1231 Encounter for screening mammogram for malignant neoplasm of breast: Secondary | ICD-10-CM

## 2021-01-05 ENCOUNTER — Ambulatory Visit: Payer: 59 | Admitting: Internal Medicine

## 2021-01-05 ENCOUNTER — Other Ambulatory Visit: Payer: Self-pay

## 2021-01-05 ENCOUNTER — Encounter: Payer: Self-pay | Admitting: Internal Medicine

## 2021-01-05 VITALS — BP 116/82 | HR 66 | Temp 98.2°F | Ht 71.0 in | Wt 251.6 lb

## 2021-01-05 DIAGNOSIS — M278 Other specified diseases of jaws: Secondary | ICD-10-CM | POA: Diagnosis not present

## 2021-01-05 DIAGNOSIS — I10 Essential (primary) hypertension: Secondary | ICD-10-CM

## 2021-01-05 DIAGNOSIS — R7309 Other abnormal glucose: Secondary | ICD-10-CM

## 2021-01-05 DIAGNOSIS — Z6835 Body mass index (BMI) 35.0-35.9, adult: Secondary | ICD-10-CM

## 2021-01-05 NOTE — Patient Instructions (Signed)

## 2021-01-05 NOTE — Progress Notes (Signed)
I,Katawbba Wiggins,acting as a Education administrator for Maximino Greenland, MD.,have documented all relevant documentation on the behalf of Maximino Greenland, MD,as directed by  Maximino Greenland, MD while in the presence of Maximino Greenland, MD.  This visit occurred during the SARS-CoV-2 public health emergency.  Safety protocols were in place, including screening questions prior to the visit, additional usage of staff PPE, and extensive cleaning of exam room while observing appropriate contact time as indicated for disinfecting solutions.  Subjective:     Patient ID: Yvonne Waters , female    DOB: 09-20-1970 , 50 y.o.   MRN: [id number]   Chief Complaint  Patient presents with   Hypertension    HPI  Patient is here for hypertension follow up. She states that she is compliant with medication. She denies headaches, chest pain and shortness of breath.   Hypertension This is a chronic problem. The current episode started more than 1 year ago. The problem has been gradually improving since onset. The problem is uncontrolled. Pertinent negatives include no blurred vision, chest pain, palpitations or shortness of breath. Past treatments include angiotensin blockers, calcium channel blockers and diuretics. The current treatment provides moderate improvement. Compliance problems include exercise.     Past Medical History:  Diagnosis Date   HTN (hypertension)    Obesity      Family History  Problem Relation Age of Onset   Hypertension Mother    Healthy Father      Current Outpatient Medications:    Olmesartan-amLODIPine-HCTZ 40-10-25 MG TABS, Take 1 tablet by mouth daily., Disp: 90 tablet, Rfl: 2   Magnesium 400 MG CAPS, Take 400 mg by mouth at bedtime. (Patient not taking: Reported on 01/05/2021), Disp: 90 capsule, Rfl: 1   Multiple Vitamins-Minerals (MULTIVITAMIN WOMEN PO), Take 1 tablet by mouth daily. (Patient not taking: Reported on 01/05/2021), Disp: , Rfl:    No Known Allergies   Review of Systems   Constitutional: Negative.   HENT:         She c/o "bump" on her left jaw. She admits that it has been there for months. No pain. No pain with chewing. She feels it swells on occasion. She thinks the swelling is precipitated by chewing.   Eyes:  Negative for blurred vision.  Respiratory: Negative.  Negative for shortness of breath.   Cardiovascular: Negative.  Negative for chest pain and palpitations.  Gastrointestinal: Negative.   Psychiatric/Behavioral: Negative.    All other systems reviewed and are negative.   Today's Vitals   01/05/21 0850  BP: 116/82  Pulse: 66  Temp: 98.2 F (36.8 C)  TempSrc: Oral  Weight: 251 lb 9.6 oz (114.1 kg)  Height: 5' 11" (1.803 m)   Body mass index is 35.09 kg/m.  Wt Readings from Last 3 Encounters:  01/05/21 251 lb 9.6 oz (114.1 kg)  06/29/20 251 lb (113.9 kg)  12/30/19 245 lb (111.1 kg)    BP Readings from Last 3 Encounters:  01/05/21 116/82  06/29/20 126/82  12/30/19 136/70    Objective:  Physical Exam Vitals and nursing note reviewed.  Constitutional:      Appearance: Normal appearance. She is obese.  HENT:     Head: Normocephalic and atraumatic.      Comments: Soft, mobile, nontender mass lateral to left jaw    Nose:     Comments: Masked     Mouth/Throat:     Comments: Masked  Eyes:     Extraocular Movements: Extraocular movements intact.  Cardiovascular:  Rate and Rhythm: Normal rate and regular rhythm.     Heart sounds: Normal heart sounds.  Pulmonary:     Effort: Pulmonary effort is normal.     Breath sounds: Normal breath sounds.  Musculoskeletal:     Cervical back: Normal range of motion.  Skin:    General: Skin is warm.  Neurological:     General: No focal deficit present.     Mental Status: She is alert.  Psychiatric:        Mood and Affect: Mood normal.        Behavior: Behavior normal.        Assessment And Plan:     1. Essential hypertension, benign Comments: Chronic, well controlled. She is  encouraged to limit her sodium intake. I will check renal function. She will f/u in 6 months for CPE.  - CMP14+EGFR  2. Other abnormal glucose Comments: Her a1c has been elevated in the past. I will recheck this today. Encouraged to limit her intake of sweetened beverages.  - Hemoglobin A1c  3. Mass of jaw Comments: There is a soft tissue mass lateral to left mandible. She agrees to ENT referral. She plans to also discuss w/ dentist at her next visit. - Ambulatory referral to ENT  4. Class 2 severe obesity due to excess calories with serious comorbidity and body mass index (BMI) of 35.0 to 35.9 in adult [facility])  She is encouraged to strive for BMI less than 30 to decrease cardiac risk. Advised to aim for at least 150 minutes of exercise per week.   Patient was given opportunity to ask questions. Patient verbalized understanding of the plan and was able to repeat key elements of the plan. All questions were answered to their satisfaction.   I, Maximino Greenland, MD, have reviewed all documentation for this visit. The documentation on 01/05/21 for the exam, diagnosis, procedures, and orders are all accurate and complete.   IF YOU HAVE BEEN REFERRED TO A SPECIALIST, IT MAY TAKE 1-2 WEEKS TO SCHEDULE/PROCESS THE REFERRAL. IF YOU HAVE NOT HEARD FROM US/SPECIALIST IN TWO WEEKS, PLEASE GIVE [place] A CALL AT [phone] X [phone].   THE PATIENT IS ENCOURAGED TO PRACTICE SOCIAL DISTANCING DUE TO THE COVID-19 PANDEMIC.

## 2021-01-06 LAB — CMP14+EGFR
ALT: 11 IU/L (ref 0–32)
AST: 13 IU/L (ref 0–40)
Albumin/Globulin Ratio: 1.6 (ref 1.2–2.2)
Albumin: 4.2 g/dL (ref 3.8–4.8)
Alkaline Phosphatase: 100 IU/L (ref 44–121)
BUN/Creatinine Ratio: 11 (ref 9–23)
BUN: 9 mg/dL (ref 6–24)
Bilirubin Total: 0.5 mg/dL (ref 0.0–1.2)
CO2: 24 mmol/L (ref 20–29)
Calcium: 9.1 mg/dL (ref 8.7–10.2)
Chloride: 103 mmol/L (ref 96–106)
Creatinine, Ser: 0.81 mg/dL (ref 0.57–1.00)
Globulin, Total: 2.7 g/dL (ref 1.5–4.5)
Glucose: 95 mg/dL (ref 65–99)
Potassium: 4.4 mmol/L (ref 3.5–5.2)
Sodium: 139 mmol/L (ref 134–144)
Total Protein: 6.9 g/dL (ref 6.0–8.5)
eGFR: 89 mL/min/{1.73_m2} (ref >59)

## 2021-01-06 LAB — HEMOGLOBIN A1C
Est. average glucose Bld gHb Est-mCnc: 128 mg/dL
Hgb A1c MFr Bld: 6.1 % — ABNORMAL HIGH (ref 4.8–5.6)

## 2021-02-28 ENCOUNTER — Other Ambulatory Visit: Payer: Self-pay | Admitting: Otolaryngology

## 2021-02-28 DIAGNOSIS — K118 Other diseases of salivary glands: Secondary | ICD-10-CM

## 2021-03-09 ENCOUNTER — Ambulatory Visit
Admission: RE | Admit: 2021-03-09 | Discharge: 2021-03-09 | Disposition: A | Discharge status: Home / Self Care | Payer: 59 | Source: Ambulatory Visit | Attending: Otolaryngology | Admitting: Otolaryngology

## 2021-03-09 DIAGNOSIS — K118 Other diseases of salivary glands: Secondary | ICD-10-CM

## 2021-03-09 MED ORDER — IOPAMIDOL (ISOVUE-300) INJECTION 61%
75.0000 mL | Freq: Once | INTRAVENOUS | Status: AC | PRN
  Administered 2021-03-09: 75 mL via INTRAVENOUS

## 2021-07-01 ENCOUNTER — Other Ambulatory Visit: Payer: Self-pay | Admitting: Internal Medicine

## 2021-07-04 ENCOUNTER — Other Ambulatory Visit: Payer: Self-pay

## 2021-07-04 ENCOUNTER — Encounter: Payer: Self-pay | Admitting: Internal Medicine

## 2021-07-04 ENCOUNTER — Ambulatory Visit (INDEPENDENT_AMBULATORY_CARE_PROVIDER_SITE_OTHER): Payer: 59 | Admitting: Internal Medicine

## 2021-07-04 VITALS — BP 118/70 | HR 72 | Temp 98.0°F | Ht 71.0 in | Wt 252.8 lb

## 2021-07-04 DIAGNOSIS — L304 Erythema intertrigo: Secondary | ICD-10-CM | POA: Diagnosis not present

## 2021-07-04 DIAGNOSIS — M545 Low back pain, unspecified: Secondary | ICD-10-CM

## 2021-07-04 DIAGNOSIS — Z0000 Encounter for general adult medical examination without abnormal findings: Secondary | ICD-10-CM | POA: Diagnosis not present

## 2021-07-04 DIAGNOSIS — R7309 Other abnormal glucose: Secondary | ICD-10-CM | POA: Diagnosis not present

## 2021-07-04 DIAGNOSIS — I10 Essential (primary) hypertension: Secondary | ICD-10-CM | POA: Insufficient documentation

## 2021-07-04 DIAGNOSIS — Z6835 Body mass index (BMI) 35.0-35.9, adult: Secondary | ICD-10-CM

## 2021-07-04 LAB — POCT URINALYSIS DIPSTICK
Bilirubin, UA: NEGATIVE
Blood, UA: NEGATIVE
Glucose, UA: NEGATIVE
Ketones, UA: NEGATIVE
Nitrite, UA: NEGATIVE
Protein, UA: NEGATIVE
Spec Grav, UA: 1.02 (ref 1.010–1.025)
Urobilinogen, UA: 0.2 E.U./dL
pH, UA: 6.5 (ref 5.0–8.0)

## 2021-07-04 MED ORDER — NYSTATIN 100000 UNIT/GM EX POWD: 1.0000 "application " | Freq: Three times a day (TID) | CUTANEOUS | 0 refills | Status: DC

## 2021-07-04 MED ORDER — OLMESARTAN-AMLODIPINE-HCTZ 40-10-25 MG PO TABS: 1.0000 | ORAL_TABLET | Freq: Every day | ORAL | 2 refills | Status: DC

## 2021-07-04 NOTE — Progress Notes (Signed)
Rich Brave Llittleton,acting as a Education administrator for Maximino Greenland, MD.,have documented all relevant documentation on the behalf of Maximino Greenland, MD,as directed by  Maximino Greenland, MD while in the presence of Maximino Greenland, MD.  This visit occurred during the SARS-CoV-2 public health emergency.  Safety protocols were in place, including screening questions prior to the visit, additional usage of staff PPE, and extensive cleaning of exam room while observing appropriate contact time as indicated for disinfecting solutions.  Subjective:     Patient ID: Yvonne Waters , female    DOB: 1970/09/24 , 51 y.o.   MRN: [id number]   Chief Complaint  Patient presents with   Annual Exam   Hypertension    HPI  She is here today for a full physical exam. She is followed by Dr. Saul Fordyce for her GYN exams.  She reports compliance with meds. She denies headaches, chest pain and shortness of breath.   Hypertension This is a chronic problem. The current episode started more than 1 year ago. The problem has been gradually improving since onset. The problem is uncontrolled. Pertinent negatives include no blurred vision, chest pain, palpitations or shortness of breath. Past treatments include angiotensin blockers, calcium channel blockers and diuretics. The current treatment provides moderate improvement. Compliance problems include exercise.     Past Medical History:  Diagnosis Date   HTN (hypertension)    Obesity      Family History  Problem Relation Age of Onset   Hypertension Mother    Healthy Father      Current Outpatient Medications:    Magnesium 400 MG CAPS, Take 400 mg by mouth at bedtime., Disp: 90 capsule, Rfl: 1   Multiple Vitamins-Minerals (MULTIVITAMIN WOMEN PO), Take 1 tablet by mouth daily., Disp: , Rfl:    nystatin powder, Apply 1 application. topically 3 (three) times daily., Disp: 45 g, Rfl: 0   Olmesartan-amLODIPine-HCTZ 40-10-25 MG TABS, Take 1 tablet by mouth daily., Disp: 90  tablet, Rfl: 2   No Known Allergies    The patient states she uses none for birth control. Last LMP was Patient's last menstrual period was 06/21/2021 (exact date).. Negative for Dysmenorrhea. Negative for: breast discharge, breast lump(s), breast pain and breast self exam. Associated symptoms include abnormal vaginal bleeding. Pertinent negatives include abnormal bleeding (hematology), anxiety, decreased libido, depression, difficulty falling sleep, dyspareunia, history of infertility, nocturia, sexual dysfunction, sleep disturbances, urinary incontinence, urinary urgency, vaginal discharge and vaginal itching. Diet regular.The patient states her exercise level is    . The patient's tobacco use is:  Social History   Tobacco Use  Smoking Status Never  Smokeless Tobacco Never  . She has been exposed to passive smoke. The patient's alcohol use is:  Social History   Substance and Sexual Activity  Alcohol Use Never     Review of Systems  Constitutional: Negative.   HENT: Negative.    Eyes: Negative.  Negative for blurred vision.  Respiratory: Negative.  Negative for shortness of breath.   Cardiovascular: Negative.  Negative for chest pain and palpitations.  Gastrointestinal: Negative.   Endocrine: Negative.   Genitourinary: Negative.   Musculoskeletal:  Positive for back pain.       She c/o right- sided low back pain. Intermittent. Dull and achy. Denies LE weakness/paresthesias. Denies fall/trauma. Admits she sits all day for work. Denies urinary/fecal incontinence.   Skin: Negative.   Allergic/Immunologic: Negative.   Neurological: Negative.   Hematological: Negative.   Psychiatric/Behavioral: Negative.  Today's Vitals   07/04/21 0952  BP: 118/70  Pulse: 72  Temp: 98 F (36.7 C)  Weight: 252 lb 12.8 oz (114.7 kg)  Height: '5\' 11"'  (1.803 m)  PainSc: 3   PainLoc: Back   Body mass index is 35.26 kg/m.  Wt Readings from Last 3 Encounters:  07/04/21 252 lb 12.8 oz (114.7  kg)  01/05/21 251 lb 9.6 oz (114.1 kg)  06/29/20 251 lb (113.9 kg)     Objective:  Physical Exam Vitals and nursing note reviewed.  Constitutional:      Appearance: Normal appearance. She is obese.  HENT:     Head: Normocephalic and atraumatic.     Right Ear: Tympanic membrane, ear canal and external ear normal.     Left Ear: Tympanic membrane, ear canal and external ear normal.     Nose:     Comments: Masked     Mouth/Throat:     Comments: Masked Eyes:     Extraocular Movements: Extraocular movements intact.     Conjunctiva/sclera: Conjunctivae normal.     Pupils: Pupils are equal, round, and reactive to light.  Cardiovascular:     Rate and Rhythm: Normal rate and regular rhythm.     Pulses: Normal pulses.     Heart sounds: Normal heart sounds.  Pulmonary:     Effort: Pulmonary effort is normal.     Breath sounds: Normal breath sounds.  Chest:  Breasts:    Tanner Score is 5.     Right: Normal.     Left: Normal.  Abdominal:     General: Abdomen is flat. Bowel sounds are normal.     Palpations: Abdomen is soft.  Genitourinary:    Comments: deferred Musculoskeletal:        General: Tenderness present. Normal range of motion.     Cervical back: Normal range of motion and neck supple.     Right lower leg: No edema.     Left lower leg: No edema.  Skin:    General: Skin is warm and dry.     Comments: Dry skin on b/l LE   Erythematous, hyperpigmented well demarcated rash underneath left breast  Scattered tattoos  Neurological:     General: No focal deficit present.     Mental Status: She is alert and oriented to person, place, and time.  Psychiatric:        Mood and Affect: Mood normal.        Behavior: Behavior normal.        Assessment And Plan:     1. Encounter for general adult medical examination w/o abnormal findings Comments: A full exam was performed. Importance of monthly self breast exams was discussed with the patient.  She is up to date with CRC and  breast CA screening. PATIENT IS ADVISED TO GET 30-45 MINUTES REGULAR EXERCISE NO LESS THAN FOUR TO FIVE DAYS PER WEEK - BOTH WEIGHTBEARING EXERCISES AND AEROBIC ARE RECOMMENDED.  PATIENT IS ADVISED TO FOLLOW A HEALTHY DIET WITH AT LEAST SIX FRUITS/VEGGIES PER DAY, DECREASE INTAKE OF RED MEAT, AND TO INCREASE FISH INTAKE TO TWO DAYS PER WEEK.  MEATS/FISH SHOULD NOT BE FRIED, BAKED OR BROILED IS PREFERABLE.  IT IS ALSO IMPORTANT TO CUT BACK ON YOUR SUGAR INTAKE. PLEASE AVOID ANYTHING WITH ADDED SUGAR, CORN SYRUP OR OTHER SWEETENERS. IF YOU MUST USE A SWEETENER, YOU CAN TRY STEVIA. IT IS ALSO IMPORTANT TO AVOID ARTIFICIALLY SWEETENERS AND DIET BEVERAGES. LASTLY, I SUGGEST WEARING SPF 50 SUNSCREEN ON  EXPOSED PARTS AND ESPECIALLY WHEN IN THE DIRECT SUNLIGHT FOR AN EXTENDED PERIOD OF TIME.  PLEASE AVOID FAST FOOD RESTAURANTS AND INCREASE YOUR WATER INTAKE.  - CBC - CMP14+EGFR - Lipid panel  2. Essential hypertension, benign Comments: Chronic, well controlled. EKG performed, NSR w/o acute changes. No med changes. She is encouraged to follow low sodium diet. She will f/u in 6 months.  - POCT Urinalysis Dipstick (81002) - Microalbumin / Creatinine Urine Ratio - EKG 12-Lead - Olmesartan-amLODIPine-HCTZ 40-10-25 MG TABS; Take 1 tablet by mouth daily.  Dispense: 90 tablet; Refill: 2  3. Erythema intertrigo Comments: I will send rx Nystatin powder to apply to affected area twice daily as needed for the next 2 weeks. She will let me know if her sx persist.   4. Acute right-sided low back pain without sciatica Comments: She was given some stretching exercises to perform. Advised to apply topical pain rub to affected areas nightly.   5. Other abnormal glucose Comments: Her a1c has been elevated in the past. I will recheck this today. She is encouraged to limit her intake of sweetened beverages.  - Hemoglobin A1c  6. Class 2 severe obesity due to excess calories with serious comorbidity and body mass index  (BMI) of 35.0 to 35.9 in adult [facility]) She is encouraged to strive for BMI less than 30 to decrease cardiac risk. Advised to aim for at least 150 minutes of exercise per week.  Patient was given opportunity to ask questions. Patient verbalized understanding of the plan and was able to repeat key elements of the plan. All questions were answered to their satisfaction.   I, Maximino Greenland, MD, have reviewed all documentation for this visit. The documentation on 07/04/21 for the exam, diagnosis, procedures, and orders are all accurate and complete.   THE PATIENT IS ENCOURAGED TO PRACTICE SOCIAL DISTANCING DUE TO THE COVID-19 PANDEMIC.

## 2021-07-04 NOTE — Patient Instructions (Signed)

## 2021-07-05 LAB — CMP14+EGFR
ALT: 11 IU/L (ref 0–32)
AST: 16 IU/L (ref 0–40)
Albumin/Globulin Ratio: 1.7 (ref 1.2–2.2)
Albumin: 4.4 g/dL (ref 3.8–4.8)
Alkaline Phosphatase: 103 IU/L (ref 44–121)
BUN/Creatinine Ratio: 11 (ref 9–23)
BUN: 9 mg/dL (ref 6–24)
Bilirubin Total: 0.6 mg/dL (ref 0.0–1.2)
CO2: 25 mmol/L (ref 20–29)
Calcium: 9.3 mg/dL (ref 8.7–10.2)
Chloride: 102 mmol/L (ref 96–106)
Creatinine, Ser: 0.8 mg/dL (ref 0.57–1.00)
Globulin, Total: 2.6 g/dL (ref 1.5–4.5)
Glucose: 98 mg/dL (ref 70–99)
Potassium: 3.9 mmol/L (ref 3.5–5.2)
Sodium: 140 mmol/L (ref 134–144)
Total Protein: 7 g/dL (ref 6.0–8.5)
eGFR: 90 mL/min/{1.73_m2} (ref >59)

## 2021-07-05 LAB — CBC
Hematocrit: 38.6 % (ref 34.0–46.6)
Hemoglobin: 12.8 g/dL (ref 11.1–15.9)
MCH: 28.3 pg (ref 26.6–33.0)
MCHC: 33.2 g/dL (ref 31.5–35.7)
MCV: 85 fL (ref 79–97)
Platelets: 234 10*3/uL (ref 150–450)
RBC: 4.52 x10E6/uL (ref 3.77–5.28)
RDW: 13.4 % (ref 11.7–15.4)
WBC: 5.7 10*3/uL (ref 3.4–10.8)

## 2021-07-05 LAB — HEMOGLOBIN A1C
Est. average glucose Bld gHb Est-mCnc: 134 mg/dL
Hgb A1c MFr Bld: 6.3 % — ABNORMAL HIGH (ref 4.8–5.6)

## 2021-07-05 LAB — LIPID PANEL
Chol/HDL Ratio: 2.4 ratio (ref 0.0–4.4)
Cholesterol, Total: 184 mg/dL (ref 100–199)
HDL: 76 mg/dL (ref >39)
LDL Chol Calc (NIH): 95 mg/dL (ref 0–99)
Triglycerides: 67 mg/dL (ref 0–149)
VLDL Cholesterol Cal: 13 mg/dL (ref 5–40)

## 2021-07-05 LAB — MICROALBUMIN / CREATININE URINE RATIO
Creatinine, Urine: 57.8 mg/dL
Microalb/Creat Ratio: <5 mg/g creat (ref 0–29)
Microalbumin, Urine: <3 ug/mL

## 2021-10-24 ENCOUNTER — Encounter: Payer: Self-pay | Admitting: Internal Medicine

## 2021-11-02 DIAGNOSIS — Z1211 Encounter for screening for malignant neoplasm of colon: Secondary | ICD-10-CM | POA: Insufficient documentation

## 2021-11-02 DIAGNOSIS — E669 Obesity, unspecified: Secondary | ICD-10-CM | POA: Insufficient documentation

## 2021-11-03 ENCOUNTER — Ambulatory Visit: Payer: 59 | Admitting: Internal Medicine

## 2021-11-03 ENCOUNTER — Other Ambulatory Visit (INDEPENDENT_AMBULATORY_CARE_PROVIDER_SITE_OTHER): Payer: 59

## 2021-11-03 ENCOUNTER — Encounter: Payer: Self-pay | Admitting: Internal Medicine

## 2021-11-03 VITALS — BP 130/80 | HR 60 | Ht 69.0 in | Wt 247.4 lb

## 2021-11-03 DIAGNOSIS — K649 Unspecified hemorrhoids: Secondary | ICD-10-CM

## 2021-11-03 DIAGNOSIS — K625 Hemorrhage of anus and rectum: Secondary | ICD-10-CM

## 2021-11-03 LAB — CBC WITH DIFFERENTIAL/PLATELET
Basophils Absolute: 0.1 10*3/uL (ref 0.0–0.1)
Basophils Relative: 1.1 % (ref 0.0–3.0)
Eosinophils Absolute: 0.2 10*3/uL (ref 0.0–0.7)
Eosinophils Relative: 3.3 % (ref 0.0–5.0)
HCT: 38.1 % (ref 36.0–46.0)
Hemoglobin: 12.6 g/dL (ref 12.0–15.0)
Lymphocytes Relative: 54.9 % — ABNORMAL HIGH (ref 12.0–46.0)
Lymphs Abs: 2.7 10*3/uL (ref 0.7–4.0)
MCHC: 33.1 g/dL (ref 30.0–36.0)
MCV: 85.8 fl (ref 78.0–100.0)
Monocytes Absolute: 0.4 10*3/uL (ref 0.1–1.0)
Monocytes Relative: 8 % (ref 3.0–12.0)
Neutro Abs: 1.6 10*3/uL (ref 1.4–7.7)
Neutrophils Relative %: 32.7 % — ABNORMAL LOW (ref 43.0–77.0)
Platelets: 211 10*3/uL (ref 150.0–400.0)
RBC: 4.44 Mil/uL (ref 3.87–5.11)
RDW: 14.3 % (ref 11.5–15.5)
WBC: 4.9 10*3/uL (ref 4.0–10.5)

## 2021-11-03 MED ORDER — HYDROCORTISONE (PERIANAL) 2.5 % EX CREA: 1.0000 | TOPICAL_CREAM | Freq: Two times a day (BID) | CUTANEOUS | 1 refills | Status: AC

## 2021-11-03 NOTE — Patient Instructions (Signed)
Your provider has requested that you go to the basement level for lab work before leaving today. Press "B" on the elevator. The lab is located at the first door on the left as you exit the elevator.   We have sent the following medications to your pharmacy for you to pick up at your convenience: Anusol   Due to recent changes in healthcare laws, you may see the results of your imaging and laboratory studies on MyChart before your provider has had a chance to review them.  We understand that in some cases there may be results that are confusing or concerning to you. Not all laboratory results come back in the same time frame and the provider may be waiting for multiple results in order to interpret others.  Please give [place] 48 hours in order for your provider to thoroughly review all the results before contacting the office for clarification of your results.   Please contact our office if the cream is not affective to discuss a further hemorrhoid banding.   If you are age 6 or older, your body mass index should be between 23-30. Your Body mass index is 36.53 kg/m. If this is out of the aforementioned range listed, please consider follow up with your Primary Care Provider.  If you are age 50 or younger, your body mass index should be between 19-25. Your Body mass index is 36.53 kg/m. If this is out of the aformentioned range listed, please consider follow up with your Primary Care Provider.   ________________________________________________________  The  GI providers would like to encourage you to use [facility] to communicate with providers for non-urgent requests or questions.  Due to long hold times on the telephone, sending your provider a message by [facility] may be a faster and more efficient way to get a response.  Please allow 48 business hours for a response.  Please remember that this is for non-urgent requests.  _______________________________________________________   Thank you for  entrusting me with your care and choosing [facility].  Dr.Dorsey

## 2021-11-03 NOTE — Progress Notes (Signed)
Chief Complaint: Hemorrhoids  HPI : 51 year old female with history of hemorrhoids, obesity and HTN presents with hemorrhoids and rectal bleeding  She has had rectal bleeding for the last 6 months. She will see bright red blood on the toilet paper. Denies rectal pain or itching. Denies constipation or straining. She has on average 1 BM per day. She does have constipation when she has her menstrual period. Denies diarrhea, ab pain, N&V, pr weight loss. Denies fam hx of colon cancer. Mother had colon polyps. Last colonoscopy in 2021 was normal except for hemorrhoids. She has never tried any treatment for her hemorrhoids. She does feel hemorrhoids protruding out of her anus.  Wt Readings from Last 3 Encounters:  11/03/21 247 lb 6 oz (112.2 kg)  07/04/21 252 lb 12.8 oz (114.7 kg)  01/05/21 251 lb 9.6 oz (114.1 kg)    Past Medical History:  Diagnosis Date   HTN (hypertension)    Obesity      Past Surgical History:  Procedure Laterality Date   COLONOSCOPY     gingival surgery  04/16/2019   Family History  Problem Relation Age of Onset   Hypertension Mother    Colon polyps Mother    Heart failure Mother    Hypertension Father    Hypertension Sister    Hypertension Brother    Hypertension Maternal Grandfather    Social History   Tobacco Use   Smoking status: Never   Smokeless tobacco: Never  Vaping Use   Vaping Use: Never used  Substance Use Topics   Alcohol use: Never   Drug use: Never   Current Outpatient Medications  Medication Sig Dispense Refill   Multiple Vitamins-Minerals (MULTIVITAMIN WOMEN PO) Take 1 tablet by mouth daily.     Olmesartan-amLODIPine-HCTZ 40-10-25 MG TABS Take 1 tablet by mouth daily. 90 tablet 2   ACETAMINOPHEN-BUTALBITAL 50-325 MG TABS Take by mouth. Take 1 to 2 tablets every 4 to 6 hours as needed. (Patient not taking: Reported on 11/03/2021)     Cholecalciferol (VITAMIN D3 PO) Take by mouth. (Patient not taking: Reported on 11/03/2021)      nystatin powder Apply 1 application. topically 3 (three) times daily. (Patient not taking: Reported on 11/03/2021) 45 g 0   No current facility-administered medications for this visit.   No Known Allergies   Review of Systems: All systems reviewed and negative except where noted in HPI.   Physical Exam: BP 130/80 (BP Location: Left Arm, Patient Position: Sitting, Cuff Size: Large)   Pulse 60   Ht 5\' 9"  (1.753 m) Comment: height measured without shoes  Wt 247 lb 6 oz (112.2 kg)   LMP 10/21/2021   BMI 36.53 kg/m  Constitutional: Pleasant,well-developed, female in no acute distress. HEENT: Normocephalic and atraumatic. Conjunctivae are normal. No scleral icterus. Cardiovascular: Normal rate, regular rhythm.  Pulmonary/chest: Effort normal and breath sounds normal. No wheezing, rales or rhonchi. Abdominal: Soft, nondistended, nontender. Bowel sounds active throughout. There are no masses palpable. No hepatomegaly. Rectal: 3 columns of grade 3 internal hemorrhoids Extremities: No edema Neurological: Alert and oriented to person place and time. Skin: Skin is warm and dry. No rashes noted. Psychiatric: Normal mood and affect. Behavior is normal.  Labs 06/2021: CBC and CMP unremarkable.  Colonoscopy 10/07/19: Internal hemorrhoids. Otherwise normal. Recommended repeat colonoscopy in 10 years.  ASSESSMENT AND PLAN: Rectal bleeding Hemorrhoids Occasional constipation Patient presents with rectal bleeding over the last few months.  She had a colonoscopy in 2021 that only showed  internal hemorrhoids.  Rectal exam today shows that she had several columns of prolapsed internal hemorrhoids.  We will start off with topical steroids to see if this will help with her hemorrhoids.  If the steroid cream is not adequate, patient can contact our clinic to schedule hemorrhoidal banding.   - Check CBC - Anusol HC cream BID for 7 days - Start daily fiber supplement, drink 8 cups of water per day, walk 30  min per day - Recommended avoidance of sitting on the toilet for long periods of time  Eulah Pont, MD

## 2022-01-04 ENCOUNTER — Encounter: Payer: Self-pay | Admitting: Internal Medicine

## 2022-01-04 ENCOUNTER — Ambulatory Visit: Payer: 59 | Admitting: Internal Medicine

## 2022-01-04 VITALS — BP 138/70 | HR 60 | Temp 98.2°F | Ht 69.0 in | Wt 252.8 lb

## 2022-01-04 DIAGNOSIS — R7309 Other abnormal glucose: Secondary | ICD-10-CM | POA: Diagnosis not present

## 2022-01-04 DIAGNOSIS — I10 Essential (primary) hypertension: Secondary | ICD-10-CM

## 2022-01-04 DIAGNOSIS — E559 Vitamin D deficiency, unspecified: Secondary | ICD-10-CM

## 2022-01-04 DIAGNOSIS — Z6837 Body mass index (BMI) 37.0-37.9, adult: Secondary | ICD-10-CM

## 2022-01-04 DIAGNOSIS — Z2821 Immunization not carried out because of patient refusal: Secondary | ICD-10-CM

## 2022-01-04 MED ORDER — OLMESARTAN-AMLODIPINE-HCTZ 40-10-25 MG PO TABS: 1.0000 | ORAL_TABLET | Freq: Every day | ORAL | 2 refills | Status: DC

## 2022-01-04 NOTE — Progress Notes (Signed)
Rich Brave [place],acting as a Education administrator for Maximino Greenland, MD.,have documented all relevant documentation on the behalf of Maximino Greenland, MD,as directed by  Maximino Greenland, MD while in the presence of Maximino Greenland, MD.    Subjective:     Patient ID: Yvonne Waters , female    DOB: [birth year] , 51 y.o.   MRN: [id number]   Chief Complaint  Patient presents with   Hypertension    HPI  Patient is here for hypertension follow up. She states that she is compliant with medication. She denies headaches, chest pain and shortness of breath. She recently celebrated her 51th birthday, reports feeling well.   Hypertension This is a chronic problem. The current episode started more than 1 year ago. The problem has been gradually improving since onset. The problem is uncontrolled. Pertinent negatives include no blurred vision, chest pain, palpitations or shortness of breath. Past treatments include angiotensin blockers, calcium channel blockers and diuretics. The current treatment provides moderate improvement. Compliance problems include exercise.      Past Medical History:  Diagnosis Date   HTN (hypertension)    Obesity      Family History  Problem Relation Age of Onset   Hypertension Mother    Colon polyps Mother    Heart failure Mother    Hypertension Father    Hypertension Sister    Hypertension Brother    Hypertension Maternal Grandfather      Current Outpatient Medications:    Cholecalciferol (VITAMIN D3 PO), Take by mouth., Disp: , Rfl:    Multiple Vitamins-Minerals (MULTIVITAMIN WOMEN PO), Take 1 tablet by mouth daily., Disp: , Rfl:    Olmesartan-amLODIPine-HCTZ 40-10-25 MG TABS, Take 1 tablet by mouth daily., Disp: 90 tablet, Rfl: 2   No Known Allergies   Review of Systems  Constitutional: Negative.   Eyes:  Negative for blurred vision.  Respiratory: Negative.  Negative for shortness of breath.   Cardiovascular: Negative.  Negative for chest pain and  palpitations.  Neurological: Negative.   Psychiatric/Behavioral: Negative.       Today's Vitals   01/04/22 0829  BP: 138/70  Pulse: 60  Temp: 98.2 F (36.8 C)  Weight: 252 lb 12.8 oz (114.7 kg)  Height: '5\' 9"'  (1.753 m)  PainSc: 0-No pain   Body mass index is 37.33 kg/m.  Wt Readings from Last 3 Encounters:  01/04/22 252 lb 12.8 oz (114.7 kg)  11/03/21 247 lb 6 oz (112.2 kg)  07/04/21 252 lb 12.8 oz (114.7 kg)     Objective:  Physical Exam Vitals and nursing note reviewed.  Constitutional:      Appearance: Normal appearance.  HENT:     Head: Normocephalic and atraumatic.  Eyes:     Extraocular Movements: Extraocular movements intact.  Cardiovascular:     Rate and Rhythm: Normal rate and regular rhythm.     Heart sounds: Normal heart sounds.  Pulmonary:     Effort: Pulmonary effort is normal.     Breath sounds: Normal breath sounds.  Musculoskeletal:     Cervical back: Normal range of motion.  Skin:    General: Skin is warm.  Neurological:     General: No focal deficit present.     Mental Status: She is alert.  Psychiatric:        Mood and Affect: Mood normal.        Behavior: Behavior normal.         Assessment And Plan:     1. Essential  hypertension, benign Comments: Chronic, fair control. Goal BP<130/80.  She is encouraged to follow a low sodium diet. She will c/w olmesartan/amlodipine/hctz 40/10/25.  - CMP14+EGFR - Olmesartan-amLODIPine-HCTZ 40-10-25 MG TABS; Take 1 tablet by mouth daily.  Dispense: 90 tablet; Refill: 2  2. Other abnormal glucose Comments: Her a1c has been elevated in the past. I will recheck this today. She is encouraged to limit her intake of sugary beverages and foods.  - Hemoglobin A1c  3. Vitamin D deficiency disease Comments: I will check a vitamin D level and supplement as needed.  - Vitamin D (25 hydroxy)  4. Class 2 severe obesity due to excess calories with serious comorbidity and body mass index (BMI) of 37.0 to 37.9 in  adult [facility]) Comments: She is encouraged to resume her regular exercise regimen, aiming for at least 150 minutes of exercise per week.   5. Influenza vaccination declined   Patient was given opportunity to ask questions. Patient verbalized understanding of the plan and was able to repeat key elements of the plan. All questions were answered to their satisfaction.   I, Maximino Greenland, MD, have reviewed all documentation for this visit. The documentation on 01/04/22 for the exam, diagnosis, procedures, and orders are all accurate and complete.   IF YOU HAVE BEEN REFERRED TO A SPECIALIST, IT MAY TAKE 1-2 WEEKS TO SCHEDULE/PROCESS THE REFERRAL. IF YOU HAVE NOT HEARD FROM US/SPECIALIST IN TWO WEEKS, PLEASE GIVE [place] A CALL AT [phone] X [phone].   THE PATIENT IS ENCOURAGED TO PRACTICE SOCIAL DISTANCING DUE TO THE COVID-19 PANDEMIC.

## 2022-01-04 NOTE — Patient Instructions (Signed)
Hypertension, Adult ?Hypertension is another name for high blood pressure. High blood pressure forces your heart to work harder to pump blood. This can cause problems over time. ?There are two numbers in a blood pressure reading. There is a top number (systolic) over a bottom number (diastolic). It is best to have a blood pressure that is below 120/80. ?What are the causes? ?The cause of this condition is not known. Some other conditions can lead to high blood pressure. ?What increases the risk? ?Some lifestyle factors can make you more likely to develop high blood pressure: ?Smoking. ?Not getting enough exercise or physical activity. ?Being overweight. ?Having too much fat, sugar, calories, or salt (sodium) in your diet. ?Drinking too much alcohol. ?Other risk factors include: ?Having any of these conditions: ?Heart disease. ?Diabetes. ?High cholesterol. ?Kidney disease. ?Obstructive sleep apnea. ?Having a family history of high blood pressure and high cholesterol. ?Age. The risk increases with age. ?Stress. ?What are the signs or symptoms? ?High blood pressure may not cause symptoms. Very high blood pressure (hypertensive crisis) may cause: ?Headache. ?Fast or uneven heartbeats (palpitations). ?Shortness of breath. ?Nosebleed. ?Vomiting or feeling like you may vomit (nauseous). ?Changes in how you see. ?Very bad chest pain. ?Feeling dizzy. ?Seizures. ?How is this treated? ?This condition is treated by making healthy lifestyle changes, such as: ?Eating healthy foods. ?Exercising more. ?Drinking less alcohol. ?Your doctor may prescribe medicine if lifestyle changes do not help enough and if: ?Your top number is above 130. ?Your bottom number is above 80. ?Your personal target blood pressure may vary. ?Follow these instructions at home: ?Eating and drinking ? ?If told, follow the DASH eating plan. To follow this plan: ?Fill one half of your plate at each meal with fruits and vegetables. ?Fill one fourth of your plate  at each meal with whole grains. Whole grains include whole-wheat pasta, brown rice, and whole-grain bread. ?Eat or drink low-fat dairy products, such as skim milk or low-fat yogurt. ?Fill one fourth of your plate at each meal with low-fat (lean) proteins. Low-fat proteins include fish, chicken without skin, eggs, beans, and tofu. ?Avoid fatty meat, cured and processed meat, or chicken with skin. ?Avoid pre-made or processed food. ?Limit the amount of salt in your diet to less than 1,500 mg each day. ?Do not drink alcohol if: ?Your doctor tells you not to drink. ?You are pregnant, may be pregnant, or are planning to become pregnant. ?If you drink alcohol: ?Limit how much you have to: ?0-1 drink a day for women. ?0-2 drinks a day for men. ?Know how much alcohol is in your drink. In the [place] one drink equals one 12 oz bottle of beer (355 mL), one 5 oz glass of wine (148 mL), or one 1? oz glass of hard liquor (44 mL). ?Lifestyle ? ?Work with your doctor to stay at a healthy weight or to lose weight. Ask your doctor what the best weight is for you. ?Get at least 30 minutes of exercise that causes your heart to beat faster (aerobic exercise) most days of the week. This may include walking, swimming, or biking. ?Get at least 30 minutes of exercise that strengthens your muscles (resistance exercise) at least 3 days a week. This may include lifting weights or doing Pilates. ?Do not smoke or use any products that contain nicotine or tobacco. If you need help quitting, ask your doctor. ?Check your blood pressure at home as told by your doctor. ?Keep all follow-up visits. ?Medicines ?Take over-the-counter and prescription medicines   only as told by your doctor. Follow directions carefully. ?Do not skip doses of blood pressure medicine. The medicine does not work as well if you skip doses. Skipping doses also puts you at risk for problems. ?Ask your doctor about side effects or reactions to medicines that you should watch  for. ?Contact a doctor if: ?You think you are having a reaction to the medicine you are taking. ?You have headaches that keep coming back. ?You feel dizzy. ?You have swelling in your ankles. ?You have trouble with your vision. ?Get help right away if: ?You get a very bad headache. ?You start to feel mixed up (confused). ?You feel weak or numb. ?You feel faint. ?You have very bad pain in your: ?Chest. ?Belly (abdomen). ?You vomit more than once. ?You have trouble breathing. ?These symptoms may be an emergency. Get help right away. Call 911. ?Do not wait to see if the symptoms will go away. ?Do not drive yourself to the hospital. ?Summary ?Hypertension is another name for high blood pressure. ?High blood pressure forces your heart to work harder to pump blood. ?For most people, a normal blood pressure is less than 120/80. ?Making healthy choices can help lower blood pressure. If your blood pressure does not get lower with healthy choices, you may need to take medicine. ?This information is not intended to replace advice given to you by your health care provider. Make sure you discuss any questions you have with your health care provider. ?Document Revised: 02/02/2021 Document Reviewed: 02/02/2021 ?Elsevier Patient Education ? 2023 Elsevier Inc. ? ?

## 2022-01-05 LAB — VITAMIN D 25 HYDROXY (VIT D DEFICIENCY, FRACTURES): Vit D, 25-Hydroxy: 20.7 ng/mL — ABNORMAL LOW (ref 30.0–100.0)

## 2022-01-05 LAB — CMP14+EGFR
ALT: 10 IU/L (ref 0–32)
AST: 10 IU/L (ref 0–40)
Albumin/Globulin Ratio: 1.3 (ref 1.2–2.2)
Albumin: 3.8 g/dL — ABNORMAL LOW (ref 3.9–4.9)
Alkaline Phosphatase: 112 IU/L (ref 44–121)
BUN/Creatinine Ratio: 14 (ref 9–23)
BUN: 11 mg/dL (ref 6–24)
Bilirubin Total: 0.3 mg/dL (ref 0.0–1.2)
CO2: 24 mmol/L (ref 20–29)
Calcium: 9.3 mg/dL (ref 8.7–10.2)
Chloride: 100 mmol/L (ref 96–106)
Creatinine, Ser: 0.76 mg/dL (ref 0.57–1.00)
Globulin, Total: 3 g/dL (ref 1.5–4.5)
Glucose: 97 mg/dL (ref 70–99)
Potassium: 4 mmol/L (ref 3.5–5.2)
Sodium: 137 mmol/L (ref 134–144)
Total Protein: 6.8 g/dL (ref 6.0–8.5)
eGFR: 95 mL/min/{1.73_m2} (ref >59)

## 2022-01-05 LAB — HEMOGLOBIN A1C
Est. average glucose Bld gHb Est-mCnc: 128 mg/dL
Hgb A1c MFr Bld: 6.1 % — ABNORMAL HIGH (ref 4.8–5.6)

## 2022-01-08 ENCOUNTER — Other Ambulatory Visit: Payer: Self-pay | Admitting: Internal Medicine

## 2022-01-08 DIAGNOSIS — Z1231 Encounter for screening mammogram for malignant neoplasm of breast: Secondary | ICD-10-CM

## 2022-01-26 ENCOUNTER — Ambulatory Visit
Admission: RE | Admit: 2022-01-26 | Discharge: 2022-01-26 | Disposition: A | Discharge status: Home / Self Care | Payer: 59 | Source: Ambulatory Visit | Attending: Internal Medicine | Admitting: Internal Medicine

## 2022-01-26 DIAGNOSIS — Z1231 Encounter for screening mammogram for malignant neoplasm of breast: Secondary | ICD-10-CM

## 2022-07-11 ENCOUNTER — Encounter: Payer: 59 | Admitting: Internal Medicine

## 2022-08-02 ENCOUNTER — Encounter: Payer: 59 | Admitting: Internal Medicine

## 2022-08-22 ENCOUNTER — Encounter: Payer: Self-pay | Admitting: Internal Medicine

## 2022-08-22 ENCOUNTER — Ambulatory Visit (INDEPENDENT_AMBULATORY_CARE_PROVIDER_SITE_OTHER): Payer: 59 | Admitting: Internal Medicine

## 2022-08-22 VITALS — BP 132/80 | HR 63 | Temp 98.2°F | Ht 69.0 in | Wt 246.6 lb

## 2022-08-22 DIAGNOSIS — Z6836 Body mass index (BMI) 36.0-36.9, adult: Secondary | ICD-10-CM | POA: Diagnosis not present

## 2022-08-22 DIAGNOSIS — I10 Essential (primary) hypertension: Secondary | ICD-10-CM | POA: Diagnosis not present

## 2022-08-22 DIAGNOSIS — Z0000 Encounter for general adult medical examination without abnormal findings: Secondary | ICD-10-CM | POA: Diagnosis not present

## 2022-08-22 DIAGNOSIS — R7309 Other abnormal glucose: Secondary | ICD-10-CM | POA: Insufficient documentation

## 2022-08-22 DIAGNOSIS — Z23 Encounter for immunization: Secondary | ICD-10-CM

## 2022-08-22 LAB — POCT URINALYSIS DIPSTICK
Bilirubin, UA: NEGATIVE
Blood, UA: NEGATIVE
Glucose, UA: NEGATIVE
Ketones, UA: NEGATIVE
Nitrite, UA: NEGATIVE
Protein, UA: NEGATIVE
Spec Grav, UA: 1.025 (ref 1.010–1.025)
Urobilinogen, UA: 0.2 E.U./dL
pH, UA: 7 (ref 5.0–8.0)

## 2022-08-22 NOTE — Patient Instructions (Signed)

## 2022-08-22 NOTE — Progress Notes (Signed)
I,Victoria T Hamilton,acting as a scribe for Gwynneth Aliment, MD.,have documented all relevant documentation on the behalf of Gwynneth Aliment, MD,as directed by  Gwynneth Aliment, MD while in the presence of Gwynneth Aliment, MD.   Subjective:     Patient ID: Yvonne Waters , female    DOB: [birth year] , 52 y.o.   MRN: [id number]   Chief Complaint  Patient presents with   Annual Exam    HPI  She is here today for a full physical exam. She is followed by Dr. Arelia Sneddon for her GYN exams.  She was last seen Nov 2023. She reports compliance with meds. She denies chest pain and shortness of breath.   Also, she reports her bp readings have been elevated lately. She admits that she has been using a wrist monitor.  She has a new job that she enjoys, but can be quite stressful. 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   Hypertension This is a chronic problem. The current episode started more than 1 year ago. The problem has been gradually improving since onset. The problem is uncontrolled. Pertinent negatives include no blurred vision, chest pain, palpitations or shortness of breath. Past treatments include angiotensin blockers, calcium channel blockers and diuretics. The current treatment provides moderate improvement. Compliance problems include exercise.      Past Medical History:  Diagnosis Date   HTN (hypertension)    Obesity      Family History  Problem Relation Age of Onset   Hypertension Mother    Colon polyps Mother    Heart failure Mother    Hypertension Father    Hypertension Sister    Hypertension Maternal Grandfather    Hypertension Brother    Breast cancer Neg Hx      Current Outpatient Medications:    Cholecalciferol (VITAMIN D3 PO), Take by mouth., Disp: , Rfl:    Multiple Vitamins-Minerals (MULTIVITAMIN WOMEN PO), Take 1 tablet by mouth daily., Disp: , Rfl:    Olmesartan-amLODIPine-HCTZ 40-10-25 MG TABS, Take 1 tablet by mouth daily., Disp: 90 tablet, Rfl:  2   No Known Allergies    The patient states she uses none for birth control. Last LMP was Patient's last menstrual period was 06/04/2022.. Negative for Dysmenorrhea. Negative for: breast discharge, breast lump(s), breast pain and breast self exam. Associated symptoms include abnormal vaginal bleeding. Pertinent negatives include abnormal bleeding (hematology), anxiety, decreased libido, depression, difficulty falling sleep, dyspareunia, history of infertility, nocturia, sexual dysfunction, sleep disturbances, urinary incontinence, urinary urgency, vaginal discharge and vaginal itching. Diet regular.    . The patient's tobacco use is:  Social History   Tobacco Use  Smoking Status Never  Smokeless Tobacco Never  . She has been exposed to passive smoke. The patient's alcohol use is:  Social History   Substance and Sexual Activity  Alcohol Use Never    Review of Systems  Constitutional: Negative.   HENT: Negative.    Eyes: Negative.  Negative for blurred vision.  Respiratory: Negative.  Negative for shortness of breath.   Cardiovascular: Negative.  Negative for chest pain and palpitations.  Gastrointestinal: Negative.   Endocrine: Negative.   Genitourinary: Negative.   Musculoskeletal: Negative.   Skin: Negative.   Allergic/Immunologic: Negative.   Neurological: Negative.   Hematological: Negative.   Psychiatric/Behavioral: Negative.       Today's Vitals   08/22/22 0959 08/22/22 1025  BP: 136/82 132/80  Pulse: 63   Temp: 98.2 F (36.8 C)   SpO2:  98%   Weight: 246 lb 9.6 oz (111.9 kg)   Height:  (1.753 m)    Body mass index is 36.42 kg/m.  Wt Readings from Last 3 Encounters:  08/22/22 246 lb 9.6 oz (111.9 kg)  01/04/22 252 lb 12.8 oz (114.7 kg)  11/03/21 247 lb 6 oz (112.2 kg)    Objective:  Physical Exam Vitals and nursing note reviewed.  Constitutional:      Appearance: Normal appearance.  HENT:     Head: Normocephalic and atraumatic.     Right Ear:  Tympanic membrane, ear canal and external ear normal.     Left Ear: Tympanic membrane, ear canal and external ear normal.     Nose: Nose normal.     Mouth/Throat:     Mouth: Mucous membranes are moist.     Pharynx: Oropharynx is clear.  Eyes:     Extraocular Movements: Extraocular movements intact.     Conjunctiva/sclera: Conjunctivae normal.     Pupils: Pupils are equal, round, and reactive to light.  Neck:     Vascular: Carotid bruit present.  Cardiovascular:     Rate and Rhythm: Normal rate and regular rhythm.     Pulses: Normal pulses.     Heart sounds: Normal heart sounds.  Pulmonary:     Effort: Pulmonary effort is normal.     Breath sounds: Normal breath sounds.  Chest:  Breasts:    Tanner Score is 5.     Right: Normal.     Left: Normal.     Comments: Scarring r breast Abdominal:     General: Abdomen is flat. Bowel sounds are normal.     Palpations: Abdomen is soft.  Genitourinary:    Comments: deferred Musculoskeletal:        General: Normal range of motion.     Cervical back: Normal range of motion and neck supple.  Skin:    General: Skin is warm and dry.  Neurological:     General: No focal deficit present.     Mental Status: She is alert and oriented to person, place, and time.  Psychiatric:        Mood and Affect: Mood normal.        Behavior: Behavior normal.         Assessment And Plan:     1. Annual physical exam Comments: A full exam was performed. Importance of monthly self breast exams was discussed with the patient.  PATIENT IS ADVISED TO GET 30-45 MINUTES REGULAR EXERCISE NO LESS THAN FOUR TO FIVE DAYS PER WEEK - BOTH WEIGHTBEARING EXERCISES AND AEROBIC ARE RECOMMENDED.  PATIENT IS ADVISED TO FOLLOW A HEALTHY DIET WITH AT LEAST SIX FRUITS/VEGGIES PER DAY, DECREASE INTAKE OF RED MEAT, AND TO INCREASE FISH INTAKE TO TWO DAYS PER WEEK.  MEATS/FISH SHOULD NOT BE FRIED, BAKED OR BROILED IS PREFERABLE.  IT IS ALSO IMPORTANT TO CUT BACK ON YOUR SUGAR  INTAKE. PLEASE AVOID ANYTHING WITH ADDED SUGAR, CORN SYRUP OR OTHER SWEETENERS. IF YOU MUST USE A SWEETENER, YOU CAN TRY STEVIA. IT IS ALSO IMPORTANT TO AVOID ARTIFICIALLY SWEETENERS AND DIET BEVERAGES. LASTLY, I SUGGEST WEARING SPF 50 SUNSCREEN ON EXPOSED PARTS AND ESPECIALLY WHEN IN THE DIRECT SUNLIGHT FOR AN EXTENDED PERIOD OF TIME.  PLEASE AVOID FAST FOOD RESTAURANTS AND INCREASE YOUR WATER INTAKE. - CMP14+EGFR - CBC - Lipid panel - Hemoglobin A1c - Vitamin D (25 hydroxy) - TSH  2. Essential hypertension, benign Comments: Chronic, fair control. Goal BP<120/80. She will c/w  olmesartan/amlodipine/hct. EKG performed, SB w/o acute changes. Agrees to RTO for NV. If elevated at that time, I plan to add a nighttime medication to help her achieve BP goal. Incorporating more exercise, at least 150 minutes of exercise/week, will also help her to achieve this goal.  - EKG 12-Lead - Microalbumin / creatinine urine ratio - POCT urinalysis dipstick  3. Class 2 severe obesity due to excess calories with serious comorbidity and body mass index (BMI) of 36.0 to 36.9 in adult Comments: She is encouraged to strive for BMI less than 30 to decrease cardiac risk. Advised to aim for at least 150 minutes of exercise per week.  4. Need for Tdap vaccination - Tdap vaccine greater than or equal to 7yo IM   Patient was given opportunity to ask questions. Patient verbalized understanding of the plan and was able to repeat key elements of the plan. All questions were answered to their satisfaction.   I, Gwynneth Aliment, MD, have reviewed all documentation for this visit. The documentation on 08/22/22 for the exam, diagnosis, procedures, and orders are all accurate and complete.   THE PATIENT IS ENCOURAGED TO PRACTICE SOCIAL DISTANCING DUE TO THE COVID-19 PANDEMIC.

## 2022-08-23 LAB — LIPID PANEL
Chol/HDL Ratio: 2.1 ratio (ref 0.0–4.4)
Cholesterol, Total: 194 mg/dL (ref 100–199)
HDL: 91 mg/dL (ref >39)
LDL Chol Calc (NIH): 92 mg/dL (ref 0–99)
Triglycerides: 57 mg/dL (ref 0–149)
VLDL Cholesterol Cal: 11 mg/dL (ref 5–40)

## 2022-08-23 LAB — CMP14+EGFR
ALT: 15 IU/L (ref 0–32)
AST: 19 IU/L (ref 0–40)
Albumin/Globulin Ratio: 1.6 (ref 1.2–2.2)
Albumin: 4.3 g/dL (ref 3.9–4.9)
Alkaline Phosphatase: 107 IU/L (ref 44–121)
BUN/Creatinine Ratio: 15 (ref 9–23)
BUN: 13 mg/dL (ref 6–24)
Bilirubin Total: 0.5 mg/dL (ref 0.0–1.2)
CO2: 22 mmol/L (ref 20–29)
Calcium: 9.5 mg/dL (ref 8.7–10.2)
Chloride: 103 mmol/L (ref 96–106)
Creatinine, Ser: 0.84 mg/dL (ref 0.57–1.00)
Globulin, Total: 2.7 g/dL (ref 1.5–4.5)
Glucose: 101 mg/dL — ABNORMAL HIGH (ref 70–99)
Potassium: 4.9 mmol/L (ref 3.5–5.2)
Sodium: 141 mmol/L (ref 134–144)
Total Protein: 7 g/dL (ref 6.0–8.5)
eGFR: 85 mL/min/{1.73_m2} (ref >59)

## 2022-08-23 LAB — HEMOGLOBIN A1C
Est. average glucose Bld gHb Est-mCnc: 137 mg/dL
Hgb A1c MFr Bld: 6.4 % — ABNORMAL HIGH (ref 4.8–5.6)

## 2022-08-23 LAB — CBC
Hematocrit: 40 % (ref 34.0–46.6)
Hemoglobin: 12.9 g/dL (ref 11.1–15.9)
MCH: 28.4 pg (ref 26.6–33.0)
MCHC: 32.3 g/dL (ref 31.5–35.7)
MCV: 88 fL (ref 79–97)
Platelets: 212 10*3/uL (ref 150–450)
RBC: 4.54 x10E6/uL (ref 3.77–5.28)
RDW: 13.3 % (ref 11.7–15.4)
WBC: 5.3 10*3/uL (ref 3.4–10.8)

## 2022-08-23 LAB — MICROALBUMIN / CREATININE URINE RATIO
Creatinine, Urine: 97.2 mg/dL
Microalb/Creat Ratio: 4 mg/g creat (ref 0–29)
Microalbumin, Urine: 3.5 ug/mL

## 2022-08-23 LAB — VITAMIN D 25 HYDROXY (VIT D DEFICIENCY, FRACTURES): Vit D, 25-Hydroxy: 28.6 ng/mL — ABNORMAL LOW (ref 30.0–100.0)

## 2022-08-23 LAB — TSH: TSH: 0.995 u[IU]/mL (ref 0.450–4.500)

## 2022-09-11 ENCOUNTER — Ambulatory Visit: Payer: 59

## 2022-09-11 VITALS — BP 118/70 | HR 61 | Temp 98.7°F | Ht 69.0 in | Wt 246.0 lb

## 2022-09-11 DIAGNOSIS — I10 Essential (primary) hypertension: Secondary | ICD-10-CM

## 2022-09-11 NOTE — Progress Notes (Signed)
Patient presents today for a bp check. Patient is taking olmesartan-amlodipine-hctz 40-10-25mg  daily.   BP Readings from Last 3 Encounters:  08/22/22 132/80  01/04/22 138/70  11/03/21 130/80    Patient was advised to continue with her current medications. She will follow up with her provider at the next visit. YL,RMA

## 2022-11-06 ENCOUNTER — Other Ambulatory Visit: Payer: Self-pay | Admitting: Internal Medicine

## 2022-11-06 DIAGNOSIS — I10 Essential (primary) hypertension: Secondary | ICD-10-CM

## 2023-02-21 ENCOUNTER — Encounter: Payer: Self-pay | Admitting: Internal Medicine

## 2023-02-21 ENCOUNTER — Ambulatory Visit: Payer: 59 | Admitting: Internal Medicine

## 2023-02-21 VITALS — BP 122/82 | HR 57 | Temp 97.7°F | Ht 69.0 in | Wt 247.0 lb

## 2023-02-21 DIAGNOSIS — Z6836 Body mass index (BMI) 36.0-36.9, adult: Secondary | ICD-10-CM

## 2023-02-21 DIAGNOSIS — Z23 Encounter for immunization: Secondary | ICD-10-CM | POA: Diagnosis not present

## 2023-02-21 DIAGNOSIS — E66812 Obesity, class 2: Secondary | ICD-10-CM

## 2023-02-21 DIAGNOSIS — I10 Essential (primary) hypertension: Secondary | ICD-10-CM

## 2023-02-21 DIAGNOSIS — Z1231 Encounter for screening mammogram for malignant neoplasm of breast: Secondary | ICD-10-CM

## 2023-02-21 DIAGNOSIS — R7309 Other abnormal glucose: Secondary | ICD-10-CM | POA: Diagnosis not present

## 2023-02-21 NOTE — Progress Notes (Signed)
I,Victoria T Deloria Lair, CMA,acting as a Neurosurgeon for Gwynneth Aliment, MD.,have documented all relevant documentation on the behalf of Gwynneth Aliment, MD,as directed by  Gwynneth Aliment, MD while in the presence of Gwynneth Aliment, MD.  Subjective:  Patient ID: Yvonne Waters , female    DOB: 04/22/1971 , 52 y.o.   MRN: [id number]  Chief Complaint  Patient presents with   Hypertension    HPI  Patient is here for hypertension follow up. She states that she is compliant with medication. She denies headaches, chest pain and shortness of breath. She denies having any specific questions or concerns.   Hypertension This is a chronic problem. The current episode started more than 1 year ago. The problem has been gradually improving since onset. The problem is uncontrolled. Pertinent negatives include no blurred vision, chest pain, palpitations or shortness of breath. Past treatments include angiotensin blockers, calcium channel blockers and diuretics. The current treatment provides moderate improvement. Compliance problems include exercise.      Past Medical History:  Diagnosis Date   HTN (hypertension)    Obesity      Family History  Problem Relation Age of Onset   Hypertension Mother    Colon polyps Mother    Heart failure Mother    Hypertension Father    Hypertension Sister    Hypertension Maternal Grandfather    Hypertension Brother    Breast cancer Neg Hx      Current Outpatient Medications:    Cholecalciferol (VITAMIN D3 PO), Take by mouth., Disp: , Rfl:    Multiple Vitamins-Minerals (MULTIVITAMIN WOMEN PO), Take 1 tablet by mouth daily., Disp: , Rfl:    Olmesartan-amLODIPine-HCTZ 40-10-25 MG TABS, TAKE 1 TABLET BY MOUTH EVERY DAY, Disp: 30 tablet, Rfl: 8   No Known Allergies   Review of Systems  Constitutional: Negative.   Eyes:  Negative for blurred vision.  Respiratory: Negative.  Negative for shortness of breath.   Cardiovascular: Negative.  Negative for chest pain and  palpitations.  Gastrointestinal: Negative.   Musculoskeletal: Negative.   Neurological: Negative.   Psychiatric/Behavioral: Negative.       Today's Vitals   02/21/23 0919  BP: 122/82  Pulse: (!) 57  Temp: 97.7 F (36.5 C)  SpO2: 98%  Weight: 247 lb (112 kg)  Height: 5\' 9"  (1.753 m)   Body mass index is 36.48 kg/m.  Wt Readings from Last 3 Encounters:  02/21/23 247 lb (112 kg)  09/11/22 246 lb (111.6 kg)  08/22/22 246 lb 9.6 oz (111.9 kg)     Objective:  Physical Exam Vitals and nursing note reviewed.  Constitutional:      Appearance: Normal appearance. She is obese.  HENT:     Head: Normocephalic and atraumatic.  Eyes:     Extraocular Movements: Extraocular movements intact.  Cardiovascular:     Rate and Rhythm: Normal rate and regular rhythm.     Heart sounds: Normal heart sounds.  Pulmonary:     Effort: Pulmonary effort is normal.     Breath sounds: Normal breath sounds.  Musculoskeletal:     Cervical back: Normal range of motion.  Skin:    General: Skin is warm.  Neurological:     General: No focal deficit present.     Mental Status: She is alert.  Psychiatric:        Mood and Affect: Mood normal.        Behavior: Behavior normal.         Assessment And  Plan:  Essential hypertension, benign Assessment & Plan: Chronic, controlled. She will continue with olmesartan/amlodipine/hydrochlorothiazide 40/10/25mg  daily. Encouraged to follow low sodium diet.  She will rto in six months for a full physical examination.   Orders: -     CMP14+EGFR  Other abnormal glucose Assessment & Plan: Previous labs reviewed, her A1c has been elevated in the past. I will check an A1c today. Reminded to avoid refined sugars including sugary drinks/foods and processed meats including bacon, sausages and deli meats.    Orders: -     Hemoglobin A1c  Class 2 severe obesity due to excess calories with serious comorbidity and body mass index (BMI) of 36.0 to 36.9 in adult  [facility]) Assessment & Plan: She is encouraged to strive for BMI less than 30 to decrease cardiac risk. Advised to aim for at least 150 minutes of exercise per week.     Need for shingles vaccine -     Varicella-zoster vaccine IM  Screening mammogram for breast cancer -     3D Screening Mammogram, Left and Right; Future  She is encouraged to strive for BMI less than 30 to decrease cardiac risk. Advised to aim for at least 150 minutes of exercise per week.    Return if symptoms worsen or fail to improve, for schedule 2nd shingles shot in 4 months NV.  Patient was given opportunity to ask questions. Patient verbalized understanding of the plan and was able to repeat key elements of the plan. All questions were answered to their satisfaction.    I, Gwynneth Aliment, MD, have reviewed all documentation for this visit. The documentation on 02/21/23 for the exam, diagnosis, procedures, and orders are all accurate and complete.   IF YOU HAVE BEEN REFERRED TO A SPECIALIST, IT MAY TAKE 1-2 WEEKS TO SCHEDULE/PROCESS THE REFERRAL. IF YOU HAVE NOT HEARD FROM US/SPECIALIST IN TWO WEEKS, PLEASE GIVE [place] A CALL AT [phone].   THE PATIENT IS ENCOURAGED TO PRACTICE SOCIAL DISTANCING DUE TO THE COVID-19 PANDEMIC.

## 2023-02-21 NOTE — Patient Instructions (Signed)
Hypertension, Adult Hypertension is another name for high blood pressure. High blood pressure forces your heart to work harder to pump blood. This can cause problems over time. There are two numbers in a blood pressure reading. There is a top number (systolic) over a bottom number (diastolic). It is best to have a blood pressure that is below 120/80. What are the causes? The cause of this condition is not known. Some other conditions can lead to high blood pressure. What increases the risk? Some lifestyle factors can make you more likely to develop high blood pressure: Smoking. Not getting enough exercise or physical activity. Being overweight. Having too much fat, sugar, calories, or salt (sodium) in your diet. Drinking too much alcohol. Other risk factors include: Having any of these conditions: Heart disease. Diabetes. High cholesterol. Kidney disease. Obstructive sleep apnea. Having a family history of high blood pressure and high cholesterol. Age. The risk increases with age. Stress. What are the signs or symptoms? High blood pressure may not cause symptoms. Very high blood pressure (hypertensive crisis) may cause: Headache. Fast or uneven heartbeats (palpitations). Shortness of breath. Nosebleed. Vomiting or feeling like you may vomit (nauseous). Changes in how you see. Very bad chest pain. Feeling dizzy. Seizures. How is this treated? This condition is treated by making healthy lifestyle changes, such as: Eating healthy foods. Exercising more. Drinking less alcohol. Your doctor may prescribe medicine if lifestyle changes do not help enough and if: Your top number is above 130. Your bottom number is above 80. Your personal target blood pressure may vary. Follow these instructions at home: Eating and drinking  If told, follow the DASH eating plan. To follow this plan: Fill one half of your plate at each meal with fruits and vegetables. Fill one fourth of your plate  at each meal with whole grains. Whole grains include whole-wheat pasta, brown rice, and whole-grain bread. Eat or drink low-fat dairy products, such as skim milk or low-fat yogurt. Fill one fourth of your plate at each meal with low-fat (lean) proteins. Low-fat proteins include fish, chicken without skin, eggs, beans, and tofu. Avoid fatty meat, cured and processed meat, or chicken with skin. Avoid pre-made or processed food. Limit the amount of salt in your diet to less than 1,500 mg each day. Do not drink alcohol if: Your doctor tells you not to drink. You are pregnant, may be pregnant, or are planning to become pregnant. If you drink alcohol: Limit how much you have to: 0-1 drink a day for women. 0-2 drinks a day for men. Know how much alcohol is in your drink. In the [place] one drink equals one 12 oz bottle of beer (355 mL), one 5 oz glass of wine (148 mL), or one 1 oz glass of hard liquor (44 mL). Lifestyle  Work with your doctor to stay at a healthy weight or to lose weight. Ask your doctor what the best weight is for you. Get at least 30 minutes of exercise that causes your heart to beat faster (aerobic exercise) most days of the week. This may include walking, swimming, or biking. Get at least 30 minutes of exercise that strengthens your muscles (resistance exercise) at least 3 days a week. This may include lifting weights or doing Pilates. Do not smoke or use any products that contain nicotine or tobacco. If you need help quitting, ask your doctor. Check your blood pressure at home as told by your doctor. Keep all follow-up visits. Medicines Take over-the-counter and prescription medicines   only as told by your doctor. Follow directions carefully. Do not skip doses of blood pressure medicine. The medicine does not work as well if you skip doses. Skipping doses also puts you at risk for problems. Ask your doctor about side effects or reactions to medicines that you should watch  for. Contact a doctor if: You think you are having a reaction to the medicine you are taking. You have headaches that keep coming back. You feel dizzy. You have swelling in your ankles. You have trouble with your vision. Get help right away if: You get a very bad headache. You start to feel mixed up (confused). You feel weak or numb. You feel faint. You have very bad pain in your: Chest. Belly (abdomen). You vomit more than once. You have trouble breathing. These symptoms may be an emergency. Get help right away. Call 911. Do not wait to see if the symptoms will go away. Do not drive yourself to the hospital. Summary Hypertension is another name for high blood pressure. High blood pressure forces your heart to work harder to pump blood. For most people, a normal blood pressure is less than 120/80. Making healthy choices can help lower blood pressure. If your blood pressure does not get lower with healthy choices, you may need to take medicine. This information is not intended to replace advice given to you by your health care provider. Make sure you discuss any questions you have with your health care provider. Document Revised: 02/02/2021 Document Reviewed: 02/02/2021 Elsevier Patient Education  2024 Elsevier Inc.  

## 2023-02-22 LAB — CMP14+EGFR
ALT: 16 [IU]/L (ref 0–32)
AST: 14 [IU]/L (ref 0–40)
Albumin: 4.4 g/dL (ref 3.8–4.9)
Alkaline Phosphatase: 103 [IU]/L (ref 44–121)
BUN/Creatinine Ratio: 17 (ref 9–23)
BUN: 12 mg/dL (ref 6–24)
Bilirubin Total: 0.5 mg/dL (ref 0.0–1.2)
CO2: 26 mmol/L (ref 20–29)
Calcium: 9.3 mg/dL (ref 8.7–10.2)
Chloride: 101 mmol/L (ref 96–106)
Creatinine, Ser: 0.72 mg/dL (ref 0.57–1.00)
Globulin, Total: 2.7 g/dL (ref 1.5–4.5)
Glucose: 94 mg/dL (ref 70–99)
Potassium: 3.8 mmol/L (ref 3.5–5.2)
Sodium: 141 mmol/L (ref 134–144)
Total Protein: 7.1 g/dL (ref 6.0–8.5)
eGFR: 101 mL/min/{1.73_m2} (ref >59)

## 2023-02-22 LAB — HEMOGLOBIN A1C
Est. average glucose Bld gHb Est-mCnc: 131 mg/dL
Hgb A1c MFr Bld: 6.2 % — ABNORMAL HIGH (ref 4.8–5.6)

## 2023-02-23 NOTE — Assessment & Plan Note (Signed)
Chronic, controlled. She will continue with olmesartan/amlodipine/hydrochlorothiazide 40/10/25mg  daily. Encouraged to follow low sodium diet.  She will rto in six months for a full physical examination.

## 2023-02-23 NOTE — Assessment & Plan Note (Signed)
She is encouraged to strive for BMI less than 30 to decrease cardiac risk. Advised to aim for at least 150 minutes of exercise per week.

## 2023-02-23 NOTE — Assessment & Plan Note (Signed)
Previous labs reviewed, her A1c has been elevated in the past. I will check an A1c today. Reminded to avoid refined sugars including sugary drinks/foods and processed meats including bacon, sausages and deli meats.  

## 2023-03-03 IMAGING — MG MM DIGITAL SCREENING BILAT W/ TOMO AND CAD
6 of 10 series · 6 of 30 positions shown · non-contrast
Comparison: Previous exam(s).

CLINICAL DATA: Screening.

EXAM:
DIGITAL SCREENING BILATERAL MAMMOGRAM WITH TOMOSYNTHESIS AND CAD
TECHNIQUE: Bilateral screening digital craniocaudal and mediolateral oblique
mammograms were obtained. Bilateral screening digital breast
tomosynthesis was performed. The images were evaluated with
computer-aided detection.

[R CC synth-2D]
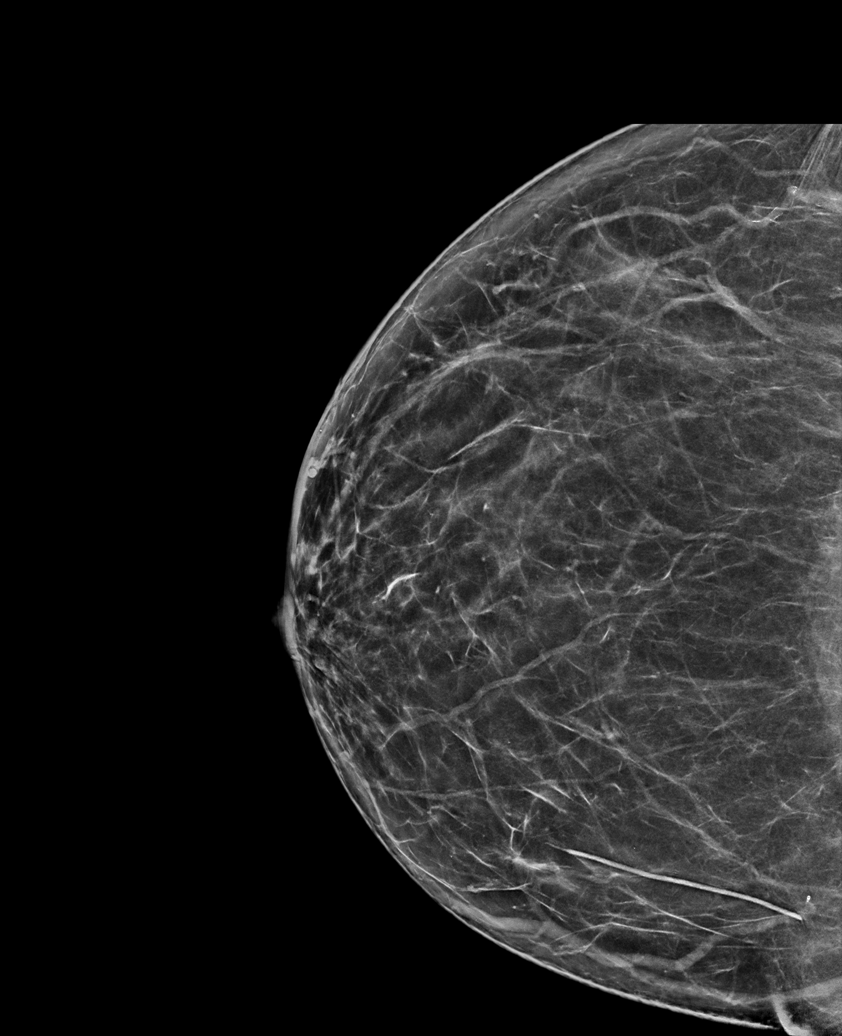

[L MLO synth-2D]
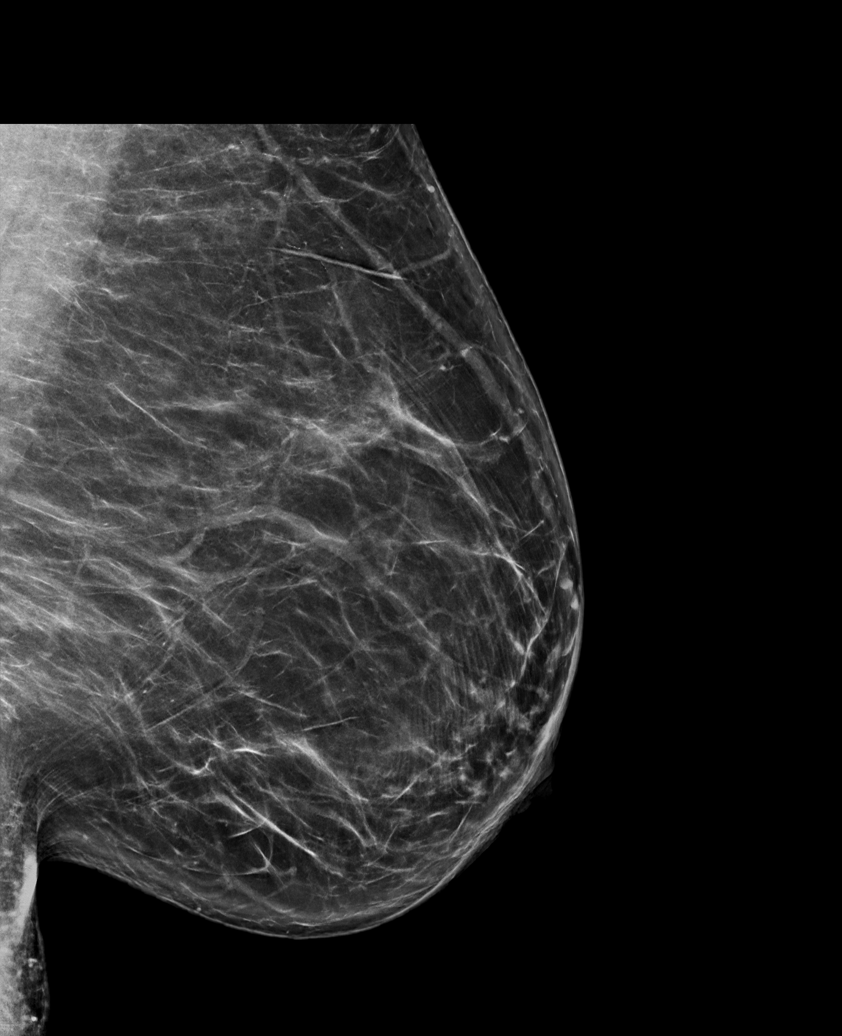

[L CC synth-2D (1 of 2)]
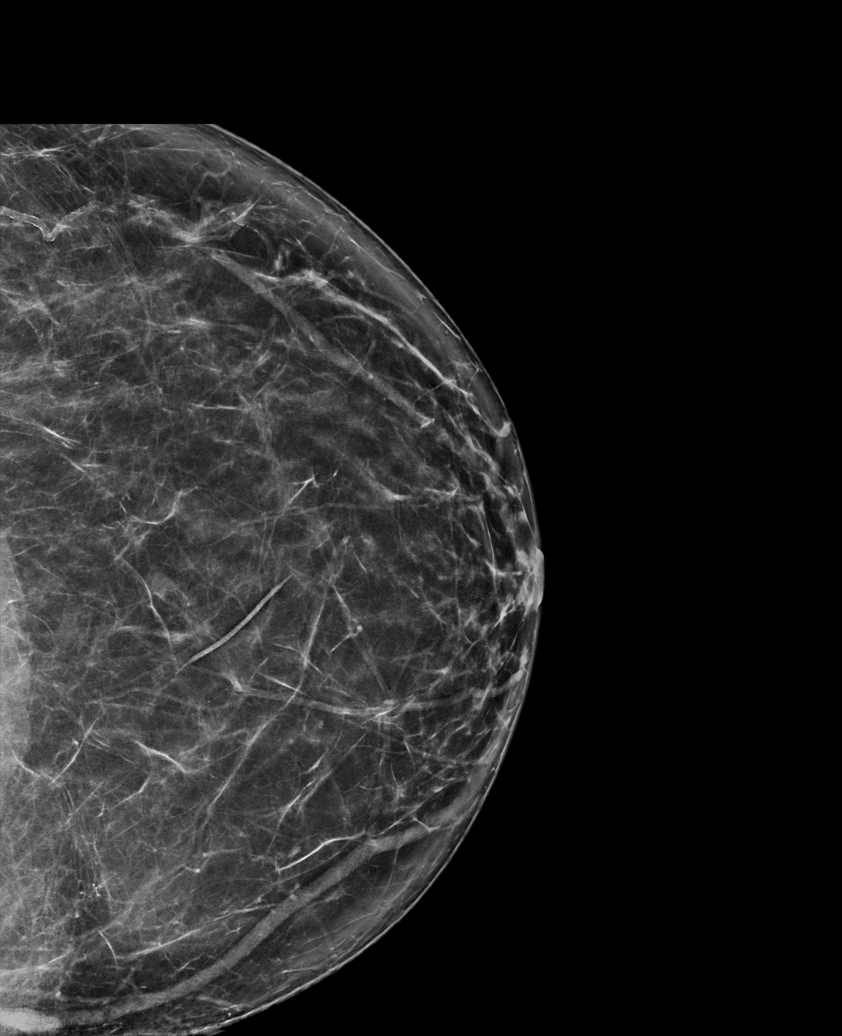

[R MLO synth-2D]
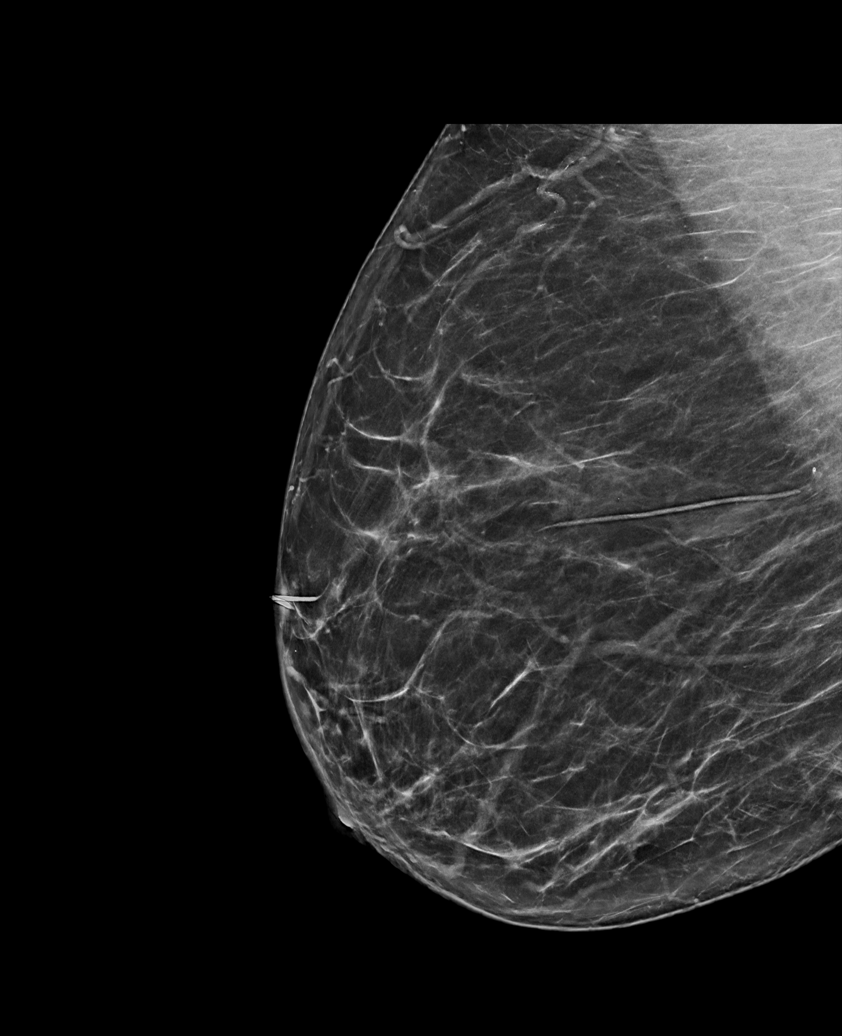

[L CC synth-2D (2 of 2)]
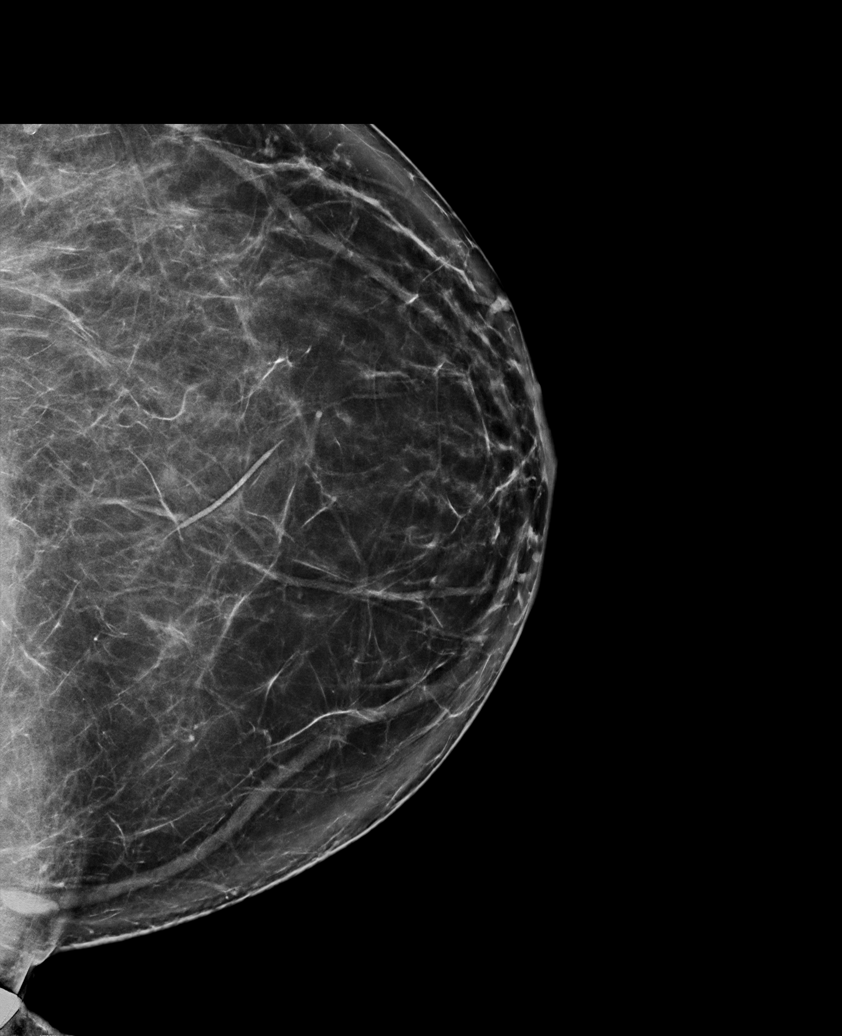

[L CC tomo · tomo slice 42/83.0]
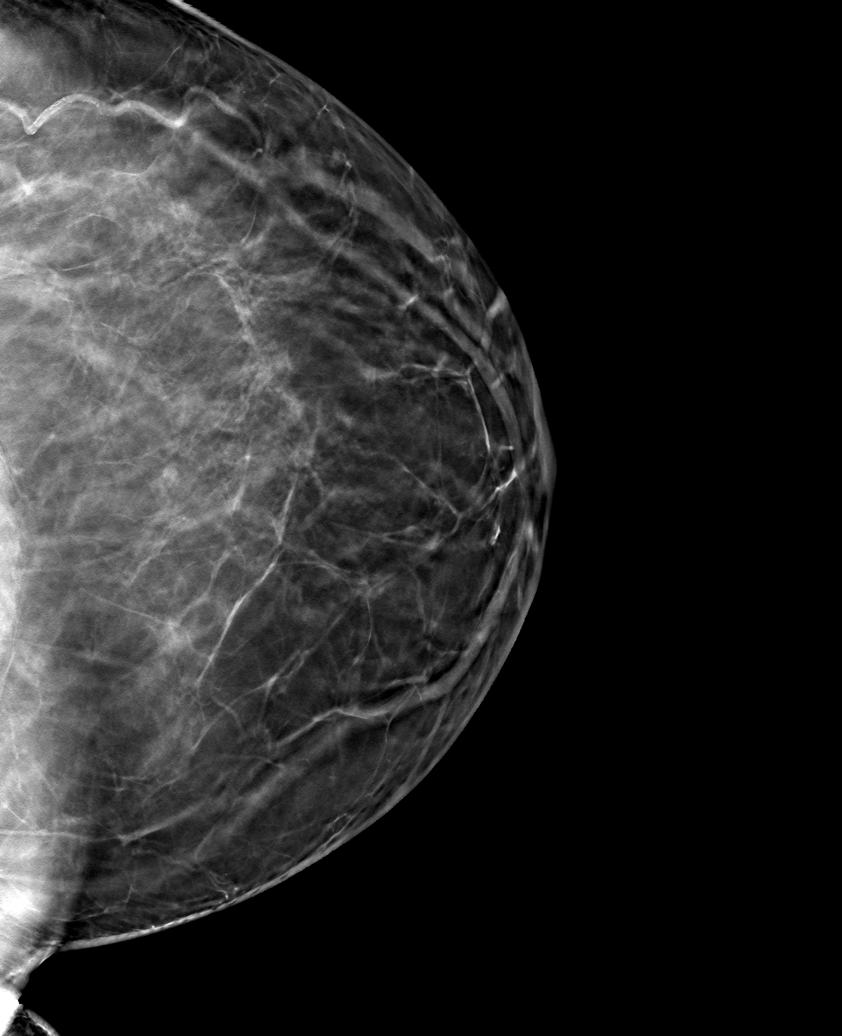

[6 of 30 positions shown; findings below may reference images not displayed]

ACR Breast Density Category b: There are scattered areas of
fibroglandular density.
FINDINGS: There are no findings suspicious for malignancy.
IMPRESSION: No mammographic evidence of malignancy. A result letter of this
screening mammogram will be mailed directly to the patient.

RECOMMENDATION:
Screening mammogram in one year. (Code:51-O-LD2)

BI-RADS CATEGORY  1: Negative.

## 2023-03-14 ENCOUNTER — Ambulatory Visit
Admission: RE | Admit: 2023-03-14 | Discharge: 2023-03-14 | Disposition: A | Discharge status: Home / Self Care | Payer: 59 | Source: Ambulatory Visit | Attending: Internal Medicine | Admitting: Internal Medicine

## 2023-03-14 DIAGNOSIS — Z1231 Encounter for screening mammogram for malignant neoplasm of breast: Secondary | ICD-10-CM

## 2023-06-25 ENCOUNTER — Ambulatory Visit: Payer: 59

## 2023-07-23 ENCOUNTER — Ambulatory Visit (INDEPENDENT_AMBULATORY_CARE_PROVIDER_SITE_OTHER): Payer: 59

## 2023-07-23 VITALS — BP 128/70 | HR 98 | Temp 98.5°F | Ht 69.0 in | Wt 247.0 lb

## 2023-07-23 DIAGNOSIS — Z23 Encounter for immunization: Secondary | ICD-10-CM | POA: Diagnosis not present

## 2023-07-23 NOTE — Progress Notes (Signed)
 Patient presents today for her 1st shingles vaccine. They deny symptoms of recent infection, fever, and chills. They received injection in L arm.

## 2023-08-25 ENCOUNTER — Other Ambulatory Visit: Payer: Self-pay | Admitting: Internal Medicine

## 2023-08-25 DIAGNOSIS — I10 Essential (primary) hypertension: Secondary | ICD-10-CM

## 2023-08-27 ENCOUNTER — Encounter: Payer: 59 | Admitting: Internal Medicine

## 2023-09-13 LAB — HM PAP SMEAR

## 2023-09-13 LAB — RESULTS CONSOLE HPV: CHL HPV: NEGATIVE

## 2023-10-25 ENCOUNTER — Ambulatory Visit: Payer: Self-pay

## 2023-11-19 ENCOUNTER — Ambulatory Visit (INDEPENDENT_AMBULATORY_CARE_PROVIDER_SITE_OTHER): Admitting: Internal Medicine

## 2023-11-19 VITALS — BP 130/82 | HR 69 | Temp 98.6°F | Ht 69.0 in | Wt 258.4 lb

## 2023-11-19 DIAGNOSIS — R600 Localized edema: Secondary | ICD-10-CM | POA: Diagnosis not present

## 2023-11-19 DIAGNOSIS — E66812 Obesity, class 2: Secondary | ICD-10-CM

## 2023-11-19 DIAGNOSIS — Z0000 Encounter for general adult medical examination without abnormal findings: Secondary | ICD-10-CM | POA: Diagnosis not present

## 2023-11-19 DIAGNOSIS — I10 Essential (primary) hypertension: Secondary | ICD-10-CM

## 2023-11-19 DIAGNOSIS — R7309 Other abnormal glucose: Secondary | ICD-10-CM

## 2023-11-19 DIAGNOSIS — Z6838 Body mass index (BMI) 38.0-38.9, adult: Secondary | ICD-10-CM

## 2023-11-19 NOTE — Patient Instructions (Signed)

## 2023-11-19 NOTE — Progress Notes (Signed)
 I,Yvonne Waters, CMA,acting as a Neurosurgeon for Yvonne LOISE Slocumb, MD.,have documented all relevant documentation on the behalf of Yvonne LOISE Slocumb, MD,as directed by  Yvonne LOISE Slocumb, MD while in the presence of Yvonne LOISE Slocumb, MD.  Subjective:    Patient ID: Yvonne Waters , female    DOB: [birth year] , 53 y.o.   MRN: [id number]  Chief Complaint  Patient presents with   Annual Exam    Patient presents today for bpc. She reports compliance with medications. Denies headache, chest pain & sob.  [facility]N: [facility]. Letter sent for pap result.  While here today she wants to discuss noticing swelling in her lower legs. She does not know what this is due to. She has tried to use compression socks, which have not helped much.   Hypertension    HPI Discussed the use of AI scribe software for clinical note transcription with the patient, who gave verbal consent to proceed.  History of Present Illness Yvonne Waters is a 53 year old female who presents for a physical exam and blood pressure check.  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 The swelling improves in the morning but worsens by late afternoon. She uses compression socks and is considering knee-high compression hose for better management. No orthopnea or dyspnea on exertion. She uses multiple pillows for comfort rather than due to breathing issues.  She is currently taking olmesartan  amlodipine  HCTZ 40/10/25 for hypertension.  Her menstrual cycle has been absent since May, which she associates with perimenopause. She reports feeling the effects of this transition.  In terms of diet, she has reduced her intake of fast foods and is mindful of her salt intake, avoiding added salt and being cautious with seasonings. She enjoys cheese but limits her bread consumption. 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   Hypertension This is a chronic problem. The current episode started more than 1 year ago. The problem has been gradually improving since onset. The problem is uncontrolled. Pertinent negatives include no blurred vision, chest pain, palpitations or shortness of breath. Past treatments include angiotensin blockers, calcium channel blockers and diuretics. The current treatment provides moderate improvement. Compliance problems include exercise.      Past Medical History:  Diagnosis Date   HTN (hypertension)    Obesity      Family History  Problem Relation Age of Onset   Hypertension Mother    Colon polyps Mother    Heart failure Mother    Hypertension Father    Hypertension Sister    Hypertension Maternal Grandfather    Hypertension Brother    Breast cancer Neg Hx      Current Outpatient Medications:    Cholecalciferol (VITAMIN D3 PO), Take by mouth., Disp: , Rfl:    Multiple Vitamins-Minerals (MULTIVITAMIN WOMEN PO), Take 1 tablet by mouth daily., Disp: , Rfl:    Olmesartan -amLODIPine -HCTZ 40-10-25 MG TABS, TAKE 1 TABLET BY MOUTH EVERY DAY, Disp: 90 tablet, Rfl: 1   No Known Allergies    The patient states she uses abstinence for birth control. Patient's last menstrual period was 09/13/2023 (exact date).. Negative for Dysmenorrhea. Negative for: breast discharge, breast lump(s), breast pain and breast self exam. Associated symptoms include abnormal vaginal bleeding. Pertinent negatives include abnormal bleeding (hematology), anxiety, decreased libido, depression, difficulty falling sleep, dyspareunia, history  of infertility, nocturia, sexual dysfunction, sleep disturbances, urinary incontinence, urinary urgency, vaginal discharge and vaginal itching. Diet regular.The patient states her exercise level is  moderate, 3-4 days per week.   . The patient's tobacco use is:  Social History   Tobacco Use  Smoking Status Never  Smokeless Tobacco Never   . She has been exposed to passive smoke. The patient's alcohol use is:  Social History   Substance and Sexual Activity  Alcohol Use Never    Review of Systems  Constitutional: Negative.   HENT: Negative.    Eyes: Negative.  Negative for blurred vision.  Respiratory: Negative.  Negative for shortness of breath.   Cardiovascular: Negative.  Negative for chest pain and palpitations.  Gastrointestinal: Negative.   Endocrine: Negative.   Genitourinary: Negative.   Musculoskeletal: Negative.   Skin: Negative.   Allergic/Immunologic: Negative.   Neurological: Negative.   Hematological: Negative.   Psychiatric/Behavioral: Negative.       Today's Vitals   11/19/23 1145  BP: 130/82  Pulse: 69  Temp: 98.6 F (37 C)  SpO2: 98%  Weight: 258 lb 6.4 oz (117.2 kg)  Height: 5' 9 (1.753 m)   Body mass index is 38.16 kg/m.  Wt Readings from Last 3 Encounters:  11/19/23 258 lb 6.4 oz (117.2 kg)  07/23/23 247 lb (112 kg)  02/21/23 247 lb (112 kg)    The 10-year ASCVD risk score (Arnett DK, et al., 2019) is: 1.1%   Values used to calculate the score:     Age: 11 years     Clincally relevant sex: Female     Is Non-Hispanic African American: Yes     Diabetic: No     Tobacco smoker: No     Systolic Blood Pressure: 130 mmHg     Is BP treated: No     HDL Cholesterol: 91 mg/dL     Total Cholesterol: 194 mg/dL  Objective:  Physical Exam Vitals and nursing note reviewed.  Constitutional:      Appearance: Normal appearance. She is obese.  HENT:     Head: Normocephalic and atraumatic.     Right Ear: Tympanic membrane, ear canal and external ear normal.     Left Ear: Tympanic membrane, ear canal and external ear normal.     Nose: Nose normal.     Mouth/Throat:     Mouth: Mucous membranes are moist.     Pharynx: Oropharynx is clear.  Eyes:     Extraocular Movements: Extraocular movements intact.     Conjunctiva/sclera: Conjunctivae normal.     Pupils: Pupils are equal, round,  and reactive to light.  Cardiovascular:     Rate and Rhythm: Normal rate and regular rhythm.     Pulses: Normal pulses.     Heart sounds: Normal heart sounds.  Pulmonary:     Effort: Pulmonary effort is normal.     Breath sounds: Normal breath sounds.  Chest:  Breasts:    Tanner Score is 5.     Right: Normal.     Left: Normal.     Comments: Scarring r breast Abdominal:     General: Abdomen is flat. Bowel sounds are normal.     Palpations: Abdomen is soft.  Genitourinary:    Comments: deferred Musculoskeletal:        General: Normal range of motion.     Cervical back: Normal range of motion and neck supple. No tenderness.  Skin:    General: Skin is warm and dry.  Neurological:  General: No focal deficit present.     Mental Status: She is alert and oriented to person, place, and time.  Psychiatric:        Mood and Affect: Mood normal.        Behavior: Behavior normal.         Assessment And Plan:     Annual physical exam Assessment & Plan: A full exam was performed.  Importance of monthly self breast exams was discussed with the patient.  She is advised to get 30-45 minutes of regular exercise, no less than four to five days per week. Both weight-bearing and aerobic exercises are recommended.  She is advised to follow a healthy diet with at least six fruits/veggies per day, decrease intake of red meat and other saturated fats and to increase fish intake to twice weekly.  Meats/fish should not be fried -- baked, boiled or broiled is preferable. It is also important to cut back on your sugar intake.  Be sure to read labels - try to avoid anything with added sugar, high fructose corn syrup or other sweeteners.  If you must use a sweetener, you can try stevia or monkfruit.  It is also important to avoid artificially sweetened foods/beverages and diet drinks. Lastly, wear SPF 50 sunscreen on exposed skin and when in direct sunlight for an extended period of time.  Be sure to avoid  fast food restaurants and aim for at least 60 ounces of water daily.      Orders: -     CBC -     CMP14+EGFR -     Lipid panel -     TSH  Essential hypertension, benign Assessment & Plan: Chronic, fair control.  Goal BP<130/80.  EKG performed, NSR w/o acute changes. She will continue with olmesartan /amlodipine /hydrochlorothiazide 40/10/25mg  daily. Encouraged to follow low sodium diet.  She will rto in six months.  - Discussed amlodipine 's potential to cause peripheral edema.  Orders: -     Urinalysis, Complete -     Microalbumin / creatinine urine ratio -     EKG 12-Lead  Peripheral edema Assessment & Plan: Chronic leg swelling likely due to amlodipine  and prolonged sitting. No dyspnea or chest pain. Discussed compression hose as first-line intervention. - Advise use of compression hose during work hours. - Encourage calf raises and squats every 90 minutes while at work. - Check renal function. - Review thyroid  function. - Consider low dose diuretic if compression hose is ineffective.   Other abnormal glucose Assessment & Plan: Previous labs reviewed, her A1c has been elevated in the past. I will check an A1c today. Reminded to avoid refined sugars including sugary drinks/foods and processed meats including bacon, sausages and deli meats.    Orders: -     Hemoglobin A1c  Class 2 severe obesity due to excess calories with serious comorbidity and body mass index (BMI) of 38.0 to 38.9 in adult [facility]) Assessment & Plan: She has gained 11 pounds since March 2025. She is encouraged to take small breaks w/ activity during the work day. Also encouraged to aim for at least 150 minutes of exercise per week.     Return for 1 year physical, 6 month bp. Patient was given opportunity to ask questions. Patient verbalized understanding of the plan and was able to repeat key elements of the plan. All questions were answered to their satisfaction.   I, Yvonne LOISE Slocumb, MD, have reviewed all  documentation for this visit. The documentation on 11/19/23 for  the exam, diagnosis, procedures, and orders are all accurate and complete.

## 2023-11-25 DIAGNOSIS — R600 Localized edema: Secondary | ICD-10-CM | POA: Insufficient documentation

## 2023-11-25 NOTE — Assessment & Plan Note (Addendum)
 Chronic, fair control.  Goal BP<130/80.  EKG performed, NSR w/o acute changes. She will continue with olmesartan /amlodipine /hydrochlorothiazide 40/10/25mg  daily. Encouraged to follow low sodium diet.  She will rto in six months.  - Discussed amlodipine 's potential to cause peripheral edema.

## 2023-11-25 NOTE — Assessment & Plan Note (Signed)

## 2023-11-25 NOTE — Assessment & Plan Note (Signed)
 Previous labs reviewed, her A1c has been elevated in the past. I will check an A1c today. Reminded to avoid refined sugars including sugary drinks/foods and processed meats including bacon, sausages and deli meats.

## 2023-11-25 NOTE — Assessment & Plan Note (Signed)
 She has gained 11 pounds since March 2025. She is encouraged to take small breaks w/ activity during the work day. Also encouraged to aim for at least 150 minutes of exercise per week.

## 2023-11-25 NOTE — Assessment & Plan Note (Signed)
 Chronic leg swelling likely due to amlodipine  and prolonged sitting. No dyspnea or chest pain. Discussed compression hose as first-line intervention. - Advise use of compression hose during work hours. - Encourage calf raises and squats every 90 minutes while at work. - Check renal function. - Review thyroid  function. - Consider low dose diuretic if compression hose is ineffective.

## 2023-11-26 LAB — URINALYSIS, COMPLETE
Bilirubin, UA: NEGATIVE
Glucose, UA: NEGATIVE
Ketones, UA: NEGATIVE
Nitrite, UA: NEGATIVE
Protein,UA: NEGATIVE
RBC, UA: NEGATIVE
Specific Gravity, UA: 1.009 (ref 1.005–1.030)
Urobilinogen, Ur: 0.2 mg/dL (ref 0.2–1.0)
pH, UA: 7.5 (ref 5.0–7.5)

## 2023-11-26 LAB — LIPID PANEL
Chol/HDL Ratio: 2.5 ratio (ref 0.0–4.4)
Cholesterol, Total: 222 mg/dL — ABNORMAL HIGH (ref 100–199)
HDL: 88 mg/dL (ref >39)
LDL Chol Calc (NIH): 122 mg/dL — ABNORMAL HIGH (ref 0–99)
Triglycerides: 70 mg/dL (ref 0–149)
VLDL Cholesterol Cal: 12 mg/dL (ref 5–40)

## 2023-11-26 LAB — CMP14+EGFR
ALT: 11 IU/L (ref 0–32)
AST: 15 IU/L (ref 0–40)
Albumin: 4.6 g/dL (ref 3.8–4.9)
Alkaline Phosphatase: 109 IU/L (ref 44–121)
BUN/Creatinine Ratio: 10 (ref 9–23)
BUN: 8 mg/dL (ref 6–24)
Bilirubin Total: 0.3 mg/dL (ref 0.0–1.2)
CO2: 19 mmol/L — ABNORMAL LOW (ref 20–29)
Calcium: 9.1 mg/dL (ref 8.7–10.2)
Chloride: 103 mmol/L (ref 96–106)
Creatinine, Ser: 0.81 mg/dL (ref 0.57–1.00)
Globulin, Total: 2.8 g/dL (ref 1.5–4.5)
Glucose: 87 mg/dL (ref 70–99)
Potassium: 4.2 mmol/L (ref 3.5–5.2)
Sodium: 144 mmol/L (ref 134–144)
Total Protein: 7.4 g/dL (ref 6.0–8.5)
eGFR: 88 mL/min/1.73 (ref >59)

## 2023-11-26 LAB — MICROALBUMIN / CREATININE URINE RATIO
Creatinine, Urine: 36.4 mg/dL
Microalb/Creat Ratio: <8 mg/g{creat} (ref 0–29)
Microalbumin, Urine: <3 ug/mL

## 2023-11-26 LAB — MICROSCOPIC EXAMINATION
Bacteria, UA: NONE SEEN
Casts: NONE SEEN /LPF
Epithelial Cells (non renal): NONE SEEN /HPF (ref 0–10)
RBC, Urine: NONE SEEN /HPF (ref 0–2)
WBC, UA: NONE SEEN /HPF (ref 0–5)

## 2023-11-26 LAB — CBC
Hematocrit: 41.1 % (ref 34.0–46.6)
Hemoglobin: 13 g/dL (ref 11.1–15.9)
MCH: 28.6 pg (ref 26.6–33.0)
MCHC: 31.6 g/dL (ref 31.5–35.7)
MCV: 91 fL (ref 79–97)
Platelets: 214 x10E3/uL (ref 150–450)
RBC: 4.54 x10E6/uL (ref 3.77–5.28)
RDW: 13.4 % (ref 11.7–15.4)
WBC: 5.6 x10E3/uL (ref 3.4–10.8)

## 2023-11-26 LAB — HEMOGLOBIN A1C
Est. average glucose Bld gHb Est-mCnc: 123 mg/dL
Hgb A1c MFr Bld: 5.9 % — ABNORMAL HIGH (ref 4.8–5.6)

## 2023-11-26 LAB — TSH: TSH: 0.996 u[IU]/mL (ref 0.450–4.500)

## 2023-12-01 ENCOUNTER — Ambulatory Visit: Payer: Self-pay | Admitting: Internal Medicine

## 2024-02-26 ENCOUNTER — Other Ambulatory Visit: Payer: Self-pay | Admitting: Internal Medicine

## 2024-02-26 DIAGNOSIS — I10 Essential (primary) hypertension: Secondary | ICD-10-CM

## 2024-04-20 ENCOUNTER — Telehealth: Payer: Self-pay

## 2024-04-20 NOTE — Telephone Encounter (Signed)
 Called to changed appt time due to RS out of the office 05/21/24.  NEW APPT DATE   Monday May 18, 2024  Appt at 10:40 AM (20 min)

## 2024-05-18 ENCOUNTER — Encounter: Payer: Self-pay | Admitting: Internal Medicine

## 2024-05-18 ENCOUNTER — Ambulatory Visit: Admitting: Internal Medicine

## 2024-05-18 VITALS — BP 122/82 | HR 66 | Temp 98.3°F | Ht 69.0 in | Wt 269.0 lb

## 2024-05-18 DIAGNOSIS — R7303 Prediabetes: Secondary | ICD-10-CM

## 2024-05-18 DIAGNOSIS — Z1159 Encounter for screening for other viral diseases: Secondary | ICD-10-CM | POA: Diagnosis not present

## 2024-05-18 DIAGNOSIS — L309 Dermatitis, unspecified: Secondary | ICD-10-CM

## 2024-05-18 DIAGNOSIS — I10 Essential (primary) hypertension: Secondary | ICD-10-CM | POA: Diagnosis not present

## 2024-05-18 MED ORDER — TRIAMCINOLONE ACETONIDE 0.1 % EX CREA: TOPICAL_CREAM | CUTANEOUS | 0 refills | Status: AC

## 2024-05-18 MED ORDER — OLMESARTAN-AMLODIPINE-HCTZ 40-10-25 MG PO TABS: 1.0000 | ORAL_TABLET | Freq: Every day | ORAL | 2 refills | Status: AC

## 2024-05-18 NOTE — Assessment & Plan Note (Signed)
 BMI 39 with underlying HTN. Weight gain of 11 pounds since July. - Encouraged resumption of regular exercise. - Advised dietary modifications to reduce caloric intake.

## 2024-05-18 NOTE — Patient Instructions (Signed)
 Hypertension, Adult Hypertension is another name for high blood pressure. High blood pressure forces your heart to work harder to pump blood. This can cause problems over time. There are two numbers in a blood pressure reading. There is a top number (systolic) over a bottom number (diastolic). It is best to have a blood pressure that is below 120/80. What are the causes? The cause of this condition is not known. Some other conditions can lead to high blood pressure. What increases the risk? Some lifestyle factors can make you more likely to develop high blood pressure: Smoking. Not getting enough exercise or physical activity. Being overweight. Having too much fat, sugar, calories, or salt (sodium) in your diet. Drinking too much alcohol. Other risk factors include: Having any of these conditions: Heart disease. Diabetes. High cholesterol. Kidney disease. Obstructive sleep apnea. Having a family history of high blood pressure and high cholesterol. Age. The risk increases with age. Stress. What are the signs or symptoms? High blood pressure may not cause symptoms. Very high blood pressure (hypertensive crisis) may cause: Headache. Fast or uneven heartbeats (palpitations). Shortness of breath. Nosebleed. Vomiting or feeling like you may vomit (nauseous). Changes in how you see. Very bad chest pain. Feeling dizzy. Seizures. How is this treated? This condition is treated by making healthy lifestyle changes, such as: Eating healthy foods. Exercising more. Drinking less alcohol. Your doctor may prescribe medicine if lifestyle changes do not help enough and if: Your top number is above 130. Your bottom number is above 80. Your personal target blood pressure may vary. Follow these instructions at home: Eating and drinking  If told, follow the DASH eating plan. To follow this plan: Fill one half of your plate at each meal with fruits and vegetables. Fill one fourth of your plate  at each meal with whole grains. Whole grains include whole-wheat pasta, brown rice, and whole-grain bread. Eat or drink low-fat dairy products, such as skim milk or low-fat yogurt. Fill one fourth of your plate at each meal with low-fat (lean) proteins. Low-fat proteins include fish, chicken without skin, eggs, beans, and tofu. Avoid fatty meat, cured and processed meat, or chicken with skin. Avoid pre-made or processed food. Limit the amount of salt in your diet to less than 1,500 mg each day. Do not drink alcohol if: Your doctor tells you not to drink. You are pregnant, may be pregnant, or are planning to become pregnant. If you drink alcohol: Limit how much you have to: 0-1 drink a day for women. 0-2 drinks a day for men. Know how much alcohol is in your drink. In the [place] one drink equals one 12 oz bottle of beer (355 mL), one 5 oz glass of wine (148 mL), or one 1 oz glass of hard liquor (44 mL). Lifestyle  Work with your doctor to stay at a healthy weight or to lose weight. Ask your doctor what the best weight is for you. Get at least 30 minutes of exercise that causes your heart to beat faster (aerobic exercise) most days of the week. This may include walking, swimming, or biking. Get at least 30 minutes of exercise that strengthens your muscles (resistance exercise) at least 3 days a week. This may include lifting weights or doing Pilates. Do not smoke or use any products that contain nicotine or tobacco. If you need help quitting, ask your doctor. Check your blood pressure at home as told by your doctor. Keep all follow-up visits. Medicines Take over-the-counter and prescription medicines  only as told by your doctor. Follow directions carefully. Do not skip doses of blood pressure medicine. The medicine does not work as well if you skip doses. Skipping doses also puts you at risk for problems. Ask your doctor about side effects or reactions to medicines that you should watch  for. Contact a doctor if: You think you are having a reaction to the medicine you are taking. You have headaches that keep coming back. You feel dizzy. You have swelling in your ankles. You have trouble with your vision. Get help right away if: You get a very bad headache. You start to feel mixed up (confused). You feel weak or numb. You feel faint. You have very bad pain in your: Chest. Belly (abdomen). You vomit more than once. You have trouble breathing. These symptoms may be an emergency. Get help right away. Call 911. Do not wait to see if the symptoms will go away. Do not drive yourself to the hospital. Summary Hypertension is another name for high blood pressure. High blood pressure forces your heart to work harder to pump blood. For most people, a normal blood pressure is less than 120/80. Making healthy choices can help lower blood pressure. If your blood pressure does not get lower with healthy choices, you may need to take medicine. This information is not intended to replace advice given to you by your health care provider. Make sure you discuss any questions you have with your health care provider. Document Revised: 02/02/2021 Document Reviewed: 02/02/2021 Elsevier Patient Education  2024 [employer].

## 2024-05-18 NOTE — Assessment & Plan Note (Signed)
 Chronic, fair control. Goal BP <130/80.  She will continue with olmesartan /amlodipine /hydrochlorothiazide 40/10/25mg  daily.  - Encouraged to follow low sodium diet.   - She will rto in six months.  - She is encouraged to incorporate more exercise into her daily routine.

## 2024-05-18 NOTE — Progress Notes (Signed)
 I,Yvonne Waters, CMA,acting as a neurosurgeon for Yvonne LOISE Slocumb, MD.,have documented all relevant documentation on the behalf of Yvonne LOISE Slocumb, MD,as directed by  Yvonne LOISE Slocumb, MD while in the presence of Yvonne LOISE Slocumb, MD.  Subjective:  Patient ID: Yvonne Waters , female    DOB: [birth year] , 54 y.o.   MRN: [id number]  Chief Complaint  Patient presents with   Hypertension    Patient is here for hypertension follow up. She states that she is compliant with medication. She denies headaches, chest pain and shortness of breath. She denies having any specific questions or concerns.     HPI Discussed the use of AI scribe software for clinical note transcription with the patient, who gave verbal consent to proceed.  History of Present Illness [name] is a 54 year old female who presents for a blood pressure check.  She has reduced her exercise routine from three to four days a week to two days a week due to her daughter's wedding and holiday activities in December. She is attempting to return to her regular exercise schedule and exercised this morning before the visit.  Her water intake is adequate, and she avoids sweet drinks. However, her diet was less healthy in December due to holiday and work-related events, resulting in an 11-pound weight gain since July.  She is currently using [facility] on [address] for her medications and is interested in switching to a 90-day supply for cost efficiency.  She has no family history of heart disease. She recently started a new job position and her daughter recently got married.   Patient is here for hypertension follow up. She states that she is compliant with medication. She denies headaches, chest pain and shortness of breath. She denies having any specific questions or concerns.   Hypertension This is a chronic problem. The current episode started more than 1 year ago. The problem has been gradually improving since onset. The  problem is uncontrolled. Pertinent negatives include no blurred vision, chest pain, palpitations or shortness of breath. Past treatments include angiotensin blockers, calcium channel blockers and diuretics. The current treatment provides moderate improvement. Compliance problems include exercise.      Past Medical History:  Diagnosis Date   HTN (hypertension)    Obesity      Family History  Problem Relation Age of Onset   Hypertension Mother    Colon polyps Mother    Heart failure Mother    Hypertension Father    Hypertension Sister    Hypertension Maternal Grandfather    Hypertension Brother    Breast cancer Neg Hx     Current Medications[1]   Allergies[2]   Review of Systems  Constitutional: Negative.   Eyes:  Negative for blurred vision.  Respiratory: Negative.  Negative for shortness of breath.   Cardiovascular: Negative.  Negative for chest pain and palpitations.  Gastrointestinal: Negative.   Skin:  Positive for rash.  Neurological: Negative.   Psychiatric/Behavioral: Negative.       Today's Vitals   05/18/24 1036  BP: 122/82  Pulse: 66  Temp: 98.3 F (36.8 C)  SpO2: 98%  Weight: 269 lb (122 kg)  Height: 5' 9 (1.753 m)   Body mass index is 39.72 kg/m.  Wt Readings from Last 3 Encounters:  05/18/24 269 lb (122 kg)  11/19/23 258 lb 6.4 oz (117.2 kg)  07/23/23 247 lb (112 kg)    The 10-year ASCVD risk score (Arnett DK, et al., 2019) is:  1.2%   Values used to calculate the score:     Age: 65 years     Clinically relevant sex: Female     Is Non-Hispanic African American: Yes     Diabetic: No     Tobacco smoker: No     Systolic Blood Pressure: 122 mmHg     Is BP treated: No     HDL Cholesterol: 88 mg/dL     Total Cholesterol: 222 mg/dL  Objective:  Physical Exam Vitals and nursing note reviewed.  Constitutional:      Appearance: Normal appearance. She is obese.  HENT:     Head: Normocephalic and atraumatic.  Eyes:     Extraocular Movements:  Extraocular movements intact.  Cardiovascular:     Rate and Rhythm: Normal rate and regular rhythm.     Heart sounds: Normal heart sounds.  Pulmonary:     Effort: Pulmonary effort is normal.     Breath sounds: Normal breath sounds.  Musculoskeletal:     Cervical back: Normal range of motion.  Skin:    General: Skin is warm.     Comments: Sl. Erythematous papular rash on anterior chest No vesicular lesions noted Keloid is present  Neurological:     General: No focal deficit present.     Mental Status: She is alert.  Psychiatric:        Mood and Affect: Mood normal.        Behavior: Behavior normal.         Assessment And Plan:   Assessment & Plan Essential hypertension, benign Chronic, fair control. Goal BP <130/80.  She will continue with olmesartan /amlodipine /hydrochlorothiazide 40/10/25mg  daily.  - Encouraged to follow low sodium diet.   - She will rto in six months.  - She is encouraged to incorporate more exercise into her daily routine.  Prediabetes Previous labs reviewed, her A1c has been elevated in the past. I will check an A1c today. Reminded to avoid refined sugars including sugary drinks/foods and processed meats including bacon, sausages and deli meats.   Morbid obesity (HCC) BMI 39 with underlying HTN. Weight gain of 11 pounds since July. - Encouraged resumption of regular exercise. - Advised dietary modifications to reduce caloric intake. Need for hepatitis B screening test Screening necessary to determine vaccination need. - Ordered hepatitis B screening test. - If needed, refer to occupational health for vaccination. Dermatitis I will send rx triamcinolone  cream to apply to affected area twice daily as needed.     Orders Placed This Encounter  Procedures   CMP14+EGFR   Hemoglobin A1c   Hepatitis B surface antibody,quantitative     Return if symptoms worsen or fail to improve.  Patient was given opportunity to ask questions. Patient verbalized  understanding of the plan and was able to repeat key elements of the plan. All questions were answered to their satisfaction.    I, Yvonne LOISE Slocumb, MD, have reviewed all documentation for this visit. The documentation on 05/18/24 for the exam, diagnosis, procedures, and orders are all accurate and complete.   IF YOU HAVE BEEN REFERRED TO A SPECIALIST, IT MAY TAKE 1-2 WEEKS TO SCHEDULE/PROCESS THE REFERRAL. IF YOU HAVE NOT HEARD FROM US /SPECIALIST IN TWO WEEKS, PLEASE GIVE US  A CALL AT [phone].      [1]  Current Outpatient Medications:    Cholecalciferol (VITAMIN D3 PO), Take by mouth., Disp: , Rfl:    Multiple Vitamins-Minerals (MULTIVITAMIN WOMEN PO), Take 1 tablet by mouth daily., Disp: , Rfl:  triamcinolone  cream (KENALOG ) 0.1 %, APPLY TO AFFECTED AREA TWICE DAILY AS NEEDED, Disp: 45 g, Rfl: 0   Olmesartan -amLODIPine -HCTZ 40-10-25 MG TABS, Take 1 tablet by mouth daily., Disp: 90 tablet, Rfl: 2 [2] No Known Allergies

## 2024-05-18 NOTE — Assessment & Plan Note (Signed)
 Previous labs reviewed, her A1c has been elevated in the past. I will check an A1c today. Reminded to avoid refined sugars including sugary drinks/foods and processed meats including bacon, sausages and deli meats.

## 2024-05-19 LAB — CMP14+EGFR
ALT: 17 IU/L (ref 0–32)
AST: 17 IU/L (ref 0–40)
Albumin: 4.2 g/dL (ref 3.8–4.9)
Alkaline Phosphatase: 137 IU/L — ABNORMAL HIGH (ref 49–135)
BUN/Creatinine Ratio: 12 (ref 9–23)
BUN: 10 mg/dL (ref 6–24)
Bilirubin Total: 0.3 mg/dL (ref 0.0–1.2)
CO2: 26 mmol/L (ref 20–29)
Calcium: 9.4 mg/dL (ref 8.7–10.2)
Chloride: 101 mmol/L (ref 96–106)
Creatinine, Ser: 0.84 mg/dL (ref 0.57–1.00)
Globulin, Total: 2.8 g/dL (ref 1.5–4.5)
Glucose: 87 mg/dL (ref 70–99)
Potassium: 3.9 mmol/L (ref 3.5–5.2)
Sodium: 140 mmol/L (ref 134–144)
Total Protein: 7 g/dL (ref 6.0–8.5)
eGFR: 84 mL/min/1.73

## 2024-05-19 LAB — HEMOGLOBIN A1C
Est. average glucose Bld gHb Est-mCnc: 131 mg/dL
Hgb A1c MFr Bld: 6.2 % — ABNORMAL HIGH (ref 4.8–5.6)

## 2024-05-19 LAB — HEPATITIS B SURFACE ANTIBODY, QUANTITATIVE: Hepatitis B Surf Ab Quant: <3.5 m[IU]/mL — ABNORMAL LOW

## 2024-05-21 ENCOUNTER — Ambulatory Visit: Payer: Self-pay | Admitting: Internal Medicine

## 2024-05-25 ENCOUNTER — Ambulatory Visit: Payer: Self-pay | Admitting: Internal Medicine

## 2024-11-24 ENCOUNTER — Encounter: Payer: Self-pay | Admitting: Internal Medicine
